# Patient Record
Sex: Male | Born: 1935 | Race: Black or African American | Hispanic: No | Marital: Married
Health system: Southern US, Community
[De-identification: ages and names within clinical notes are randomized; demographics above are authoritative.]

## PROBLEM LIST (undated history)

## (undated) ENCOUNTER — Emergency Department (HOSPITAL_COMMUNITY): Discharge status: Against Medical Advice | Payer: Self-pay

---

## 2004-12-26 ENCOUNTER — Emergency Department: Payer: Self-pay | Admitting: Emergency Medicine

## 2005-01-26 ENCOUNTER — Ambulatory Visit: Payer: Self-pay | Admitting: Internal Medicine

## 2005-12-26 ENCOUNTER — Inpatient Hospital Stay: Payer: Self-pay | Admitting: Surgery

## 2005-12-26 ENCOUNTER — Other Ambulatory Visit: Payer: Self-pay

## 2006-01-10 ENCOUNTER — Emergency Department: Payer: Self-pay | Admitting: Emergency Medicine

## 2007-09-06 ENCOUNTER — Emergency Department: Payer: Self-pay | Admitting: Emergency Medicine

## 2008-09-02 ENCOUNTER — Emergency Department: Payer: Self-pay | Admitting: Emergency Medicine

## 2008-11-30 ENCOUNTER — Ambulatory Visit: Payer: Self-pay | Admitting: Family Medicine

## 2009-02-23 ENCOUNTER — Ambulatory Visit: Payer: Self-pay | Admitting: Internal Medicine

## 2009-05-09 ENCOUNTER — Emergency Department: Payer: Self-pay | Admitting: Emergency Medicine

## 2010-08-18 ENCOUNTER — Emergency Department: Payer: Self-pay | Admitting: Emergency Medicine

## 2012-08-06 ENCOUNTER — Ambulatory Visit: Payer: Self-pay | Admitting: Internal Medicine

## 2012-09-28 ENCOUNTER — Emergency Department: Payer: Self-pay | Admitting: Emergency Medicine

## 2012-09-28 LAB — COMPREHENSIVE METABOLIC PANEL
Albumin: 2.7 g/dL — ABNORMAL LOW (ref 3.4–5.0)
Alkaline Phosphatase: 186 U/L — ABNORMAL HIGH (ref 50–136)
Anion Gap: 5 — ABNORMAL LOW (ref 7–16)
BUN: 15 mg/dL (ref 7–18)
Bilirubin,Total: 0.7 mg/dL (ref 0.2–1.0)
Chloride: 104 mmol/L (ref 98–107)
Creatinine: 1.04 mg/dL (ref 0.60–1.30)
EGFR (African American): >60
Osmolality: 272 (ref 275–301)
Potassium: 4.4 mmol/L (ref 3.5–5.1)
SGOT(AST): 49 U/L — ABNORMAL HIGH (ref 15–37)
SGPT (ALT): 12 U/L (ref 12–78)
Sodium: 135 mmol/L — ABNORMAL LOW (ref 136–145)
Total Protein: 8.2 g/dL (ref 6.4–8.2)

## 2012-09-28 LAB — CBC
HCT: 41 % (ref 40.0–52.0)
HGB: 13.3 g/dL (ref 13.0–18.0)
MCH: 25.4 pg — ABNORMAL LOW (ref 26.0–34.0)
MCHC: 32.5 g/dL (ref 32.0–36.0)
Platelet: 302 10*3/uL (ref 150–440)
RBC: 5.24 10*6/uL (ref 4.40–5.90)
WBC: 9.6 10*3/uL (ref 3.8–10.6)

## 2012-09-28 LAB — TROPONIN I: Troponin-I: <0.02 ng/mL

## 2012-10-03 LAB — CULTURE, BLOOD (SINGLE)

## 2012-10-09 ENCOUNTER — Ambulatory Visit: Payer: Self-pay | Admitting: Physician Assistant

## 2012-10-15 ENCOUNTER — Emergency Department: Payer: Self-pay | Admitting: Emergency Medicine

## 2012-10-15 LAB — CBC
HGB: 14.5 g/dL (ref 13.0–18.0)
MCH: 25.5 pg — ABNORMAL LOW (ref 26.0–34.0)
MCHC: 32.9 g/dL (ref 32.0–36.0)
Platelet: 391 10*3/uL (ref 150–440)
RDW: 16.4 % — ABNORMAL HIGH (ref 11.5–14.5)
WBC: 11.4 10*3/uL — ABNORMAL HIGH (ref 3.8–10.6)

## 2012-10-15 LAB — COMPREHENSIVE METABOLIC PANEL
Alkaline Phosphatase: 338 U/L — ABNORMAL HIGH (ref 50–136)
BUN: 18 mg/dL (ref 7–18)
Chloride: 104 mmol/L (ref 98–107)
Co2: 25 mmol/L (ref 21–32)
Creatinine: 1.21 mg/dL (ref 0.60–1.30)
EGFR (African American): >60
EGFR (Non-African Amer.): 56 — ABNORMAL LOW
Glucose: 113 mg/dL — ABNORMAL HIGH (ref 65–99)
Potassium: 5.1 mmol/L (ref 3.5–5.1)
Sodium: 135 mmol/L — ABNORMAL LOW (ref 136–145)

## 2012-10-15 LAB — LIPASE, BLOOD: Lipase: 53 U/L — ABNORMAL LOW (ref 73–393)

## 2012-10-15 LAB — CK TOTAL AND CKMB (NOT AT ARMC): CK, Total: 103 U/L (ref 35–232)

## 2012-10-31 ENCOUNTER — Emergency Department: Payer: Self-pay | Admitting: Emergency Medicine

## 2012-10-31 LAB — URINALYSIS, COMPLETE
Blood: NEGATIVE
Glucose,UR: NEGATIVE mg/dL (ref 0–75)
Ketone: NEGATIVE
Leukocyte Esterase: NEGATIVE
Nitrite: NEGATIVE
Ph: 5 (ref 4.5–8.0)
Protein: 30
Squamous Epithelial: <1
WBC UR: 3 /HPF (ref 0–5)

## 2012-10-31 LAB — CK TOTAL AND CKMB (NOT AT ARMC)
CK, Total: 90 U/L (ref 35–232)
CK-MB: <0.5 ng/mL — ABNORMAL LOW (ref 0.5–3.6)

## 2012-10-31 LAB — TROPONIN I: Troponin-I: <0.02 ng/mL

## 2012-10-31 LAB — COMPREHENSIVE METABOLIC PANEL
Albumin: 2.3 g/dL — ABNORMAL LOW (ref 3.4–5.0)
BUN: 24 mg/dL — ABNORMAL HIGH (ref 7–18)
Chloride: 103 mmol/L (ref 98–107)
Co2: 25 mmol/L (ref 21–32)
Creatinine: 1.25 mg/dL (ref 0.60–1.30)
EGFR (African American): >60
EGFR (Non-African Amer.): 54 — ABNORMAL LOW
Potassium: 5.1 mmol/L (ref 3.5–5.1)
Sodium: 134 mmol/L — ABNORMAL LOW (ref 136–145)

## 2012-10-31 LAB — MAGNESIUM: Magnesium: 2.1 mg/dL

## 2012-10-31 LAB — CBC
HGB: 12.8 g/dL — ABNORMAL LOW (ref 13.0–18.0)
MCH: 24.9 pg — ABNORMAL LOW (ref 26.0–34.0)
MCHC: 32.4 g/dL (ref 32.0–36.0)
MCV: 77 fL — ABNORMAL LOW (ref 80–100)
Platelet: 341 10*3/uL (ref 150–440)
RBC: 5.15 10*6/uL (ref 4.40–5.90)
WBC: 11 10*3/uL — ABNORMAL HIGH (ref 3.8–10.6)

## 2012-10-31 LAB — LIPASE, BLOOD: Lipase: 72 U/L — ABNORMAL LOW (ref 73–393)

## 2012-11-09 ENCOUNTER — Emergency Department: Payer: Self-pay | Admitting: Emergency Medicine

## 2012-11-09 LAB — COMPREHENSIVE METABOLIC PANEL
Albumin: 2.3 g/dL — ABNORMAL LOW (ref 3.4–5.0)
Alkaline Phosphatase: 414 U/L — ABNORMAL HIGH (ref 50–136)
Anion Gap: 6 — ABNORMAL LOW (ref 7–16)
BUN: 19 mg/dL — ABNORMAL HIGH (ref 7–18)
Chloride: 104 mmol/L (ref 98–107)
Co2: 25 mmol/L (ref 21–32)
Creatinine: 1.32 mg/dL — ABNORMAL HIGH (ref 0.60–1.30)
EGFR (African American): 59 — ABNORMAL LOW
Glucose: 118 mg/dL — ABNORMAL HIGH (ref 65–99)
Osmolality: 273 (ref 275–301)
Potassium: 4.5 mmol/L (ref 3.5–5.1)
SGOT(AST): 119 U/L — ABNORMAL HIGH (ref 15–37)
Sodium: 135 mmol/L — ABNORMAL LOW (ref 136–145)
Total Protein: 8.6 g/dL — ABNORMAL HIGH (ref 6.4–8.2)

## 2012-11-09 LAB — CBC WITH DIFFERENTIAL/PLATELET
Eosinophil #: 0 10*3/uL (ref 0.0–0.7)
HCT: 40.4 % (ref 40.0–52.0)
HGB: 12.8 g/dL — ABNORMAL LOW (ref 13.0–18.0)
Lymphocyte #: 1.2 10*3/uL (ref 1.0–3.6)
Lymphocyte %: 11.7 %
MCH: 24 pg — ABNORMAL LOW (ref 26.0–34.0)
MCV: 76 fL — ABNORMAL LOW (ref 80–100)
Monocyte #: 0.8 x10 3/mm (ref 0.2–1.0)
Neutrophil #: 8.5 10*3/uL — ABNORMAL HIGH (ref 1.4–6.5)
Platelet: 377 10*3/uL (ref 150–440)
RDW: 17 % — ABNORMAL HIGH (ref 11.5–14.5)
WBC: 10.7 10*3/uL — ABNORMAL HIGH (ref 3.8–10.6)

## 2012-11-10 LAB — URINALYSIS, COMPLETE
Bacteria: NONE SEEN
Bilirubin,UR: NEGATIVE
Blood: NEGATIVE
Glucose,UR: NEGATIVE mg/dL (ref 0–75)
Leukocyte Esterase: NEGATIVE
Ph: 5 (ref 4.5–8.0)
Protein: 100
Squamous Epithelial: NONE SEEN
WBC UR: 2 /HPF (ref 0–5)

## 2012-11-11 ENCOUNTER — Ambulatory Visit: Payer: Self-pay | Admitting: Physician Assistant

## 2012-11-11 LAB — COMPREHENSIVE METABOLIC PANEL
Alkaline Phosphatase: 394 U/L — ABNORMAL HIGH (ref 50–136)
Anion Gap: 8 (ref 7–16)
Bilirubin,Total: 1.6 mg/dL — ABNORMAL HIGH (ref 0.2–1.0)
Calcium, Total: 8.7 mg/dL (ref 8.5–10.1)
Chloride: 102 mmol/L (ref 98–107)
Creatinine: 1.26 mg/dL (ref 0.60–1.30)
EGFR (Non-African Amer.): 54 — ABNORMAL LOW
Glucose: 112 mg/dL — ABNORMAL HIGH (ref 65–99)
Osmolality: 272 (ref 275–301)
Potassium: 4.7 mmol/L (ref 3.5–5.1)
SGOT(AST): 147 U/L — ABNORMAL HIGH (ref 15–37)
SGPT (ALT): 26 U/L (ref 12–78)

## 2012-11-11 LAB — CBC WITH DIFFERENTIAL/PLATELET
Basophil #: 0.2 10*3/uL — ABNORMAL HIGH (ref 0.0–0.1)
Eosinophil #: 0 10*3/uL (ref 0.0–0.7)
Eosinophil %: 0 %
HCT: 38.1 % — ABNORMAL LOW (ref 40.0–52.0)
HGB: 12.3 g/dL — ABNORMAL LOW (ref 13.0–18.0)
Lymphocyte %: 10.3 %
MCH: 24.4 pg — ABNORMAL LOW (ref 26.0–34.0)
Monocyte #: 0.8 x10 3/mm (ref 0.2–1.0)
Neutrophil #: 8.8 10*3/uL — ABNORMAL HIGH (ref 1.4–6.5)
Neutrophil %: 80.9 %
WBC: 10.9 10*3/uL — ABNORMAL HIGH (ref 3.8–10.6)

## 2012-11-11 LAB — TSH: Thyroid Stimulating Horm: 1.1 u[IU]/mL

## 2012-11-13 ENCOUNTER — Ambulatory Visit: Payer: Self-pay | Admitting: Physician Assistant

## 2012-12-12 ENCOUNTER — Inpatient Hospital Stay: Payer: Self-pay | Admitting: Student

## 2012-12-12 LAB — COMPREHENSIVE METABOLIC PANEL
Albumin: 2 g/dL — ABNORMAL LOW (ref 3.4–5.0)
Alkaline Phosphatase: 444 U/L — ABNORMAL HIGH (ref 50–136)
Anion Gap: 10 (ref 7–16)
BUN: 21 mg/dL — ABNORMAL HIGH (ref 7–18)
Bilirubin,Total: 2.1 mg/dL — ABNORMAL HIGH (ref 0.2–1.0)
Chloride: 102 mmol/L (ref 98–107)
Co2: 25 mmol/L (ref 21–32)
EGFR (African American): >60
EGFR (Non-African Amer.): >60
Glucose: 111 mg/dL — ABNORMAL HIGH (ref 65–99)
Osmolality: 277 (ref 275–301)
Potassium: 4 mmol/L (ref 3.5–5.1)
SGOT(AST): 78 U/L — ABNORMAL HIGH (ref 15–37)

## 2012-12-12 LAB — URINALYSIS, COMPLETE
Blood: NEGATIVE
Glucose,UR: NEGATIVE mg/dL (ref 0–75)
Nitrite: NEGATIVE
Ph: 5 (ref 4.5–8.0)
Protein: 30
RBC,UR: 1 /HPF (ref 0–5)
Specific Gravity: 1.025 (ref 1.003–1.030)
WBC UR: 5 /HPF (ref 0–5)

## 2012-12-12 LAB — PRO B NATRIURETIC PEPTIDE: B-Type Natriuretic Peptide: 336 pg/mL (ref 0–450)

## 2012-12-12 LAB — CBC
HCT: 33.7 % — ABNORMAL LOW (ref 40.0–52.0)
HGB: 10.8 g/dL — ABNORMAL LOW (ref 13.0–18.0)
MCH: 24 pg — ABNORMAL LOW (ref 26.0–34.0)
MCHC: 32.1 g/dL (ref 32.0–36.0)
MCV: 75 fL — ABNORMAL LOW (ref 80–100)
RDW: 18.9 % — ABNORMAL HIGH (ref 11.5–14.5)
WBC: 8.9 10*3/uL (ref 3.8–10.6)

## 2012-12-12 LAB — TSH: Thyroid Stimulating Horm: 0.617 u[IU]/mL

## 2012-12-12 LAB — TROPONIN I: Troponin-I: <0.02 ng/mL

## 2012-12-12 LAB — CK TOTAL AND CKMB (NOT AT ARMC): CK-MB: <0.5 ng/mL — ABNORMAL LOW (ref 0.5–3.6)

## 2012-12-13 LAB — CBC WITH DIFFERENTIAL/PLATELET
Basophil %: 0.2 %
Eosinophil #: 0 10*3/uL (ref 0.0–0.7)
Eosinophil %: 0.3 %
Lymphocyte #: 1 10*3/uL (ref 1.0–3.6)
MCHC: 32.1 g/dL (ref 32.0–36.0)
MCV: 75 fL — ABNORMAL LOW (ref 80–100)
Monocyte #: 0.5 x10 3/mm (ref 0.2–1.0)
Monocyte %: 7.4 %
Neutrophil #: 5.6 10*3/uL (ref 1.4–6.5)
Neutrophil %: 77.7 %
RBC: 3.8 10*6/uL — ABNORMAL LOW (ref 4.40–5.90)
RDW: 18.9 % — ABNORMAL HIGH (ref 11.5–14.5)
WBC: 7.2 10*3/uL (ref 3.8–10.6)

## 2012-12-13 LAB — BASIC METABOLIC PANEL
BUN: 15 mg/dL (ref 7–18)
Calcium, Total: 8.7 mg/dL (ref 8.5–10.1)
Chloride: 105 mmol/L (ref 98–107)
Co2: 26 mmol/L (ref 21–32)
EGFR (African American): >60
EGFR (Non-African Amer.): >60
Glucose: 115 mg/dL — ABNORMAL HIGH (ref 65–99)
Potassium: 4.1 mmol/L (ref 3.5–5.1)
Sodium: 138 mmol/L (ref 136–145)

## 2012-12-13 LAB — MAGNESIUM: Magnesium: 1.7 mg/dL — ABNORMAL LOW

## 2012-12-14 LAB — HEPATIC FUNCTION PANEL A (ARMC)
Albumin: 1.5 g/dL — ABNORMAL LOW (ref 3.4–5.0)
Alkaline Phosphatase: 426 U/L — ABNORMAL HIGH (ref 50–136)
Bilirubin,Total: 1.7 mg/dL — ABNORMAL HIGH (ref 0.2–1.0)
SGOT(AST): 90 U/L — ABNORMAL HIGH (ref 15–37)
SGPT (ALT): <6 U/L — ABNORMAL LOW (ref 12–78)
Total Protein: 6.1 g/dL — ABNORMAL LOW (ref 6.4–8.2)

## 2012-12-15 LAB — PHOSPHORUS: Phosphorus: 2.5 mg/dL (ref 2.5–4.9)

## 2012-12-15 LAB — PSA: PSA: 427 ng/mL — ABNORMAL HIGH (ref 0.0–4.0)

## 2012-12-16 LAB — PATHOLOGY REPORT

## 2012-12-17 LAB — COMPREHENSIVE METABOLIC PANEL
Albumin: 1.3 g/dL — ABNORMAL LOW (ref 3.4–5.0)
Alkaline Phosphatase: 459 U/L — ABNORMAL HIGH (ref 50–136)
Anion Gap: 9 (ref 7–16)
Bilirubin,Total: 1.1 mg/dL — ABNORMAL HIGH (ref 0.2–1.0)
Chloride: 103 mmol/L (ref 98–107)
Co2: 23 mmol/L (ref 21–32)
Creatinine: 0.75 mg/dL (ref 0.60–1.30)
EGFR (African American): >60
EGFR (Non-African Amer.): >60
Osmolality: 271 (ref 275–301)
Potassium: 4.4 mmol/L (ref 3.5–5.1)
Sodium: 135 mmol/L — ABNORMAL LOW (ref 136–145)

## 2012-12-17 LAB — CALCIUM: Calcium, Total: 8.3 mg/dL — ABNORMAL LOW (ref 8.5–10.1)

## 2012-12-28 ENCOUNTER — Ambulatory Visit: Payer: Self-pay | Admitting: Internal Medicine

## 2012-12-28 LAB — FERRITIN: Ferritin (ARMC): 2356 ng/mL — ABNORMAL HIGH (ref 8–388)

## 2012-12-28 LAB — CBC CANCER CENTER
Basophil #: 0.1 x10 3/mm (ref 0.0–0.1)
Basophil %: 1.3 %
Eosinophil #: 0 x10 3/mm (ref 0.0–0.7)
Eosinophil %: 0.5 %
Lymphocyte #: 1.1 x10 3/mm (ref 1.0–3.6)
Lymphocyte %: 15.7 %
MCH: 23.3 pg — ABNORMAL LOW (ref 26.0–34.0)
MCHC: 31.1 g/dL — ABNORMAL LOW (ref 32.0–36.0)
Monocyte #: 0.4 x10 3/mm (ref 0.2–1.0)
Neutrophil #: 5.5 x10 3/mm (ref 1.4–6.5)
Neutrophil %: 77.2 %
Platelet: 354 x10 3/mm (ref 150–440)
RBC: 3.86 10*6/uL — ABNORMAL LOW (ref 4.40–5.90)
RDW: 21.6 % — ABNORMAL HIGH (ref 11.5–14.5)

## 2012-12-28 LAB — CREATININE, SERUM
Creatinine: 1.08 mg/dL (ref 0.60–1.30)
EGFR (African American): >60
EGFR (Non-African Amer.): >60

## 2012-12-28 LAB — IRON AND TIBC: Iron Bind.Cap.(Total): 141 ug/dL — ABNORMAL LOW (ref 250–450)

## 2012-12-30 LAB — CEA: CEA: 2 ng/mL (ref 0.0–4.7)

## 2013-01-15 LAB — COMPREHENSIVE METABOLIC PANEL
Albumin: 2 g/dL — ABNORMAL LOW (ref 3.4–5.0)
Alkaline Phosphatase: 466 U/L — ABNORMAL HIGH (ref 50–136)
Anion Gap: 9 (ref 7–16)
Chloride: 100 mmol/L (ref 98–107)
Co2: 25 mmol/L (ref 21–32)
Creatinine: 1.09 mg/dL (ref 0.60–1.30)
EGFR (African American): >60
EGFR (Non-African Amer.): >60
SGOT(AST): 24 U/L (ref 15–37)
Sodium: 134 mmol/L — ABNORMAL LOW (ref 136–145)
Total Protein: 6.9 g/dL (ref 6.4–8.2)

## 2013-01-15 LAB — CBC CANCER CENTER
Basophil #: 0.1 "x10 3/mm "
Basophil %: 1.2 %
Eosinophil #: 0 "x10 3/mm "
Eosinophil %: 0.5 %
HCT: 28.8 % — ABNORMAL LOW
HGB: 9.2 g/dL — ABNORMAL LOW
Lymphocyte %: 25.2 %
Lymphs Abs: 1.6 "x10 3/mm "
MCH: 24.8 pg — ABNORMAL LOW
MCHC: 32 g/dL
MCV: 78 fL — ABNORMAL LOW
Monocyte #: 0.5 "x10 3/mm "
Monocyte %: 8.2 %
Neutrophil #: 4.1 "x10 3/mm "
Neutrophil %: 64.9 %
Platelet: 284 "x10 3/mm "
RBC: 3.72 "x10 6/mm " — ABNORMAL LOW
RDW: 25 % — ABNORMAL HIGH
WBC: 6.3 "x10 3/mm "

## 2013-01-17 ENCOUNTER — Ambulatory Visit: Payer: Self-pay | Admitting: Internal Medicine

## 2013-01-27 LAB — COMPREHENSIVE METABOLIC PANEL
Albumin: 2.1 g/dL — ABNORMAL LOW (ref 3.4–5.0)
Alkaline Phosphatase: 339 U/L — ABNORMAL HIGH (ref 50–136)
Anion Gap: 11 (ref 7–16)
Bilirubin,Total: 1.3 mg/dL — ABNORMAL HIGH (ref 0.2–1.0)
Co2: 23 mmol/L (ref 21–32)
Creatinine: 1.08 mg/dL (ref 0.60–1.30)
EGFR (Non-African Amer.): >60
Osmolality: 272 (ref 275–301)
Potassium: 3.8 mmol/L (ref 3.5–5.1)
SGOT(AST): 51 U/L — ABNORMAL HIGH (ref 15–37)
SGPT (ALT): <6 U/L — ABNORMAL LOW (ref 12–78)
Sodium: 135 mmol/L — ABNORMAL LOW (ref 136–145)
Total Protein: 6.8 g/dL (ref 6.4–8.2)

## 2013-01-27 LAB — CBC
HGB: 9.1 g/dL — ABNORMAL LOW (ref 13.0–18.0)
MCH: 24.7 pg — ABNORMAL LOW (ref 26.0–34.0)
MCHC: 32.1 g/dL (ref 32.0–36.0)
MCV: 77 fL — ABNORMAL LOW (ref 80–100)
Platelet: 330 10*3/uL (ref 150–440)
RBC: 3.68 10*6/uL — ABNORMAL LOW (ref 4.40–5.90)
RDW: 23.3 % — ABNORMAL HIGH (ref 11.5–14.5)
WBC: 8 10*3/uL (ref 3.8–10.6)

## 2013-01-27 LAB — URINALYSIS, COMPLETE
Nitrite: NEGATIVE
RBC,UR: 2 /HPF (ref 0–5)
Specific Gravity: 1.03 (ref 1.003–1.030)
Squamous Epithelial: 6
WBC UR: 6 /HPF (ref 0–5)

## 2013-01-27 LAB — CK TOTAL AND CKMB (NOT AT ARMC): CK-MB: 0.5 ng/mL (ref 0.5–3.6)

## 2013-01-27 LAB — LIPASE, BLOOD: Lipase: 65 U/L — ABNORMAL LOW (ref 73–393)

## 2013-01-28 LAB — COMPREHENSIVE METABOLIC PANEL
Albumin: 1.8 g/dL — ABNORMAL LOW (ref 3.4–5.0)
Anion Gap: 9 (ref 7–16)
BUN: 15 mg/dL (ref 7–18)
Bilirubin,Total: 1.1 mg/dL — ABNORMAL HIGH (ref 0.2–1.0)
Co2: 23 mmol/L (ref 21–32)
Osmolality: 274 (ref 275–301)
Potassium: 3.7 mmol/L (ref 3.5–5.1)
SGOT(AST): 41 U/L — ABNORMAL HIGH (ref 15–37)

## 2013-01-28 LAB — CBC WITH DIFFERENTIAL/PLATELET
Basophil %: 1 %
Eosinophil #: 0 10*3/uL (ref 0.0–0.7)
Eosinophil %: 0.2 %
HGB: 7.6 g/dL — ABNORMAL LOW (ref 13.0–18.0)
Lymphocyte #: 1.2 10*3/uL (ref 1.0–3.6)
Lymphocyte %: 17.8 %
MCH: 24.8 pg — ABNORMAL LOW (ref 26.0–34.0)
MCHC: 31.5 g/dL — ABNORMAL LOW (ref 32.0–36.0)
MCV: 79 fL — ABNORMAL LOW (ref 80–100)
Monocyte #: 0.5 x10 3/mm (ref 0.2–1.0)
Monocyte %: 7 %
Neutrophil #: 5.1 10*3/uL (ref 1.4–6.5)
Platelet: 272 10*3/uL (ref 150–440)
RBC: 3.08 10*6/uL — ABNORMAL LOW (ref 4.40–5.90)
WBC: 6.9 10*3/uL (ref 3.8–10.6)

## 2013-01-29 ENCOUNTER — Inpatient Hospital Stay: Payer: Self-pay | Admitting: Internal Medicine

## 2013-01-29 LAB — CBC WITH DIFFERENTIAL/PLATELET
Basophil %: 0.3 %
Eosinophil #: 0 10*3/uL (ref 0.0–0.7)
Eosinophil %: 0.1 %
HCT: 24.5 % — ABNORMAL LOW (ref 40.0–52.0)
Lymphocyte #: 0.9 10*3/uL — ABNORMAL LOW (ref 1.0–3.6)
MCHC: 31.8 g/dL — ABNORMAL LOW (ref 32.0–36.0)
Monocyte #: 0.4 x10 3/mm (ref 0.2–1.0)
Neutrophil %: 75.7 %
Platelet: 236 10*3/uL (ref 150–440)
RBC: 3.11 10*6/uL — ABNORMAL LOW (ref 4.40–5.90)

## 2013-01-29 LAB — COMPREHENSIVE METABOLIC PANEL
Alkaline Phosphatase: 276 U/L — ABNORMAL HIGH (ref 50–136)
Calcium, Total: 8.4 mg/dL — ABNORMAL LOW (ref 8.5–10.1)
Co2: 24 mmol/L (ref 21–32)
Creatinine: 0.92 mg/dL (ref 0.60–1.30)
EGFR (African American): >60
EGFR (Non-African Amer.): >60
Glucose: 104 mg/dL — ABNORMAL HIGH (ref 65–99)
Osmolality: 277 (ref 275–301)
SGOT(AST): 40 U/L — ABNORMAL HIGH (ref 15–37)

## 2013-01-30 LAB — HEPATIC FUNCTION PANEL A (ARMC)
Albumin: 1.6 g/dL — ABNORMAL LOW (ref 3.4–5.0)
Alkaline Phosphatase: 269 U/L — ABNORMAL HIGH (ref 50–136)
Bilirubin,Total: 0.9 mg/dL (ref 0.2–1.0)
SGOT(AST): 28 U/L (ref 15–37)
SGPT (ALT): <6 U/L — ABNORMAL LOW (ref 12–78)
Total Protein: 5.4 g/dL — ABNORMAL LOW (ref 6.4–8.2)

## 2013-01-30 LAB — POTASSIUM: Potassium: 3.5 mmol/L

## 2013-01-30 LAB — HEMOGLOBIN: HGB: 7.4 g/dL — ABNORMAL LOW

## 2013-01-31 LAB — MAGNESIUM: Magnesium: 1.6 mg/dL — ABNORMAL LOW

## 2013-02-01 LAB — HEMOGLOBIN: HGB: 7.5 g/dL — ABNORMAL LOW (ref 13.0–18.0)

## 2013-02-01 LAB — POTASSIUM: Potassium: 3.6 mmol/L (ref 3.5–5.1)

## 2013-02-01 LAB — MAGNESIUM: Magnesium: 1.7 mg/dL — ABNORMAL LOW

## 2013-02-08 ENCOUNTER — Ambulatory Visit: Payer: Self-pay | Admitting: Internal Medicine

## 2013-02-08 LAB — CBC CANCER CENTER
Eosinophil #: 0 x10 3/mm (ref 0.0–0.7)
HCT: 30.5 % — ABNORMAL LOW (ref 40.0–52.0)
HGB: 9.3 g/dL — ABNORMAL LOW (ref 13.0–18.0)
MCH: 24.1 pg — ABNORMAL LOW (ref 26.0–34.0)
Monocyte #: 0.6 x10 3/mm (ref 0.2–1.0)
Neutrophil %: 78.3 %
Platelet: 273 x10 3/mm (ref 150–440)
RBC: 3.87 10*6/uL — ABNORMAL LOW (ref 4.40–5.90)
WBC: 11.5 x10 3/mm — ABNORMAL HIGH (ref 3.8–10.6)

## 2013-02-08 LAB — HEPATIC FUNCTION PANEL A (ARMC)
Albumin: 1.8 g/dL — ABNORMAL LOW (ref 3.4–5.0)
Alkaline Phosphatase: 378 U/L — ABNORMAL HIGH (ref 50–136)
Bilirubin, Direct: 0.7 mg/dL — ABNORMAL HIGH (ref 0.00–0.20)
SGOT(AST): 49 U/L — ABNORMAL HIGH (ref 15–37)
SGPT (ALT): 7 U/L — ABNORMAL LOW (ref 12–78)
Total Protein: 6.9 g/dL (ref 6.4–8.2)

## 2013-02-08 LAB — CREATININE, SERUM
Creatinine: 1.26 mg/dL (ref 0.60–1.30)
EGFR (African American): >60
EGFR (Non-African Amer.): 54 — ABNORMAL LOW

## 2013-02-16 ENCOUNTER — Ambulatory Visit: Payer: Self-pay | Admitting: Internal Medicine

## 2013-02-16 ENCOUNTER — Inpatient Hospital Stay: Payer: Self-pay | Admitting: Internal Medicine

## 2013-02-16 LAB — CBC WITH DIFFERENTIAL/PLATELET
Bands: 3 %
HCT: 29.3 % — ABNORMAL LOW
HGB: 8.8 g/dL — ABNORMAL LOW
Lymphocytes: 23 %
MCH: 24 pg — ABNORMAL LOW
MCHC: 29.8 g/dL — ABNORMAL LOW
MCV: 80 fL
Myelocyte: 1 %
NRBC/100 WBC: 3 /
Platelet: 194 x10 3/mm 3
RBC: 3.65 x10 6/mm 3 — ABNORMAL LOW
RDW: 22.4 % — ABNORMAL HIGH
Segmented Neutrophils: 73 %
WBC: 16.9 x10 3/mm 3 — ABNORMAL HIGH

## 2013-02-16 LAB — COMPREHENSIVE METABOLIC PANEL
Alkaline Phosphatase: 437 U/L — ABNORMAL HIGH (ref 50–136)
Anion Gap: 21 — ABNORMAL HIGH (ref 7–16)
BUN: 33 mg/dL — ABNORMAL HIGH (ref 7–18)
Calcium, Total: 9 mg/dL (ref 8.5–10.1)
Co2: 14 mmol/L — ABNORMAL LOW (ref 21–32)
Creatinine: 1.67 mg/dL — ABNORMAL HIGH (ref 0.60–1.30)
EGFR (African American): 44 — ABNORMAL LOW
Osmolality: 289 (ref 275–301)
Sodium: 141 mmol/L (ref 136–145)
Total Protein: 6.1 g/dL — ABNORMAL LOW (ref 6.4–8.2)

## 2013-02-16 LAB — URINALYSIS, COMPLETE
Glucose,UR: 50 mg/dL (ref 0–75)
Hyaline Cast: 8
Leukocyte Esterase: NEGATIVE
Nitrite: NEGATIVE
Protein: 30
Squamous Epithelial: 1
WBC UR: 5 /HPF (ref 0–5)

## 2013-02-16 LAB — CK TOTAL AND CKMB (NOT AT ARMC): CK-MB: 0.6 ng/mL (ref 0.5–3.6)

## 2013-02-16 LAB — MAGNESIUM: Magnesium: 2.5 mg/dL — ABNORMAL HIGH

## 2013-02-16 LAB — TROPONIN I: Troponin-I: 0.03 ng/mL

## 2013-02-17 LAB — CBC WITH DIFFERENTIAL/PLATELET
Bands: 2 %
Lymphocytes: 10 %
MCH: 24.2 pg — ABNORMAL LOW (ref 26.0–34.0)
MCV: 79 fL — ABNORMAL LOW (ref 80–100)
Monocytes: 2 %
RDW: 22.2 % — ABNORMAL HIGH (ref 11.5–14.5)
Segmented Neutrophils: 85 %

## 2013-02-17 LAB — TROPONIN I: Troponin-I: 0.03 ng/mL

## 2013-02-17 LAB — BASIC METABOLIC PANEL
BUN: 34 mg/dL — ABNORMAL HIGH (ref 7–18)
Chloride: 111 mmol/L — ABNORMAL HIGH (ref 98–107)
EGFR (African American): >60
EGFR (Non-African Amer.): 53 — ABNORMAL LOW
Glucose: 105 mg/dL — ABNORMAL HIGH (ref 65–99)
Osmolality: 297 (ref 275–301)

## 2013-02-17 LAB — CK TOTAL AND CKMB (NOT AT ARMC)
CK, Total: 93 U/L (ref 35–232)
CK, Total: 215 U/L (ref 35–232)

## 2013-02-18 LAB — PSA: PSA: 664.1 ng/mL — ABNORMAL HIGH (ref 0.0–4.0)

## 2013-02-21 LAB — CULTURE, BLOOD (SINGLE)

## 2013-02-24 ENCOUNTER — Ambulatory Visit: Payer: Self-pay | Admitting: Internal Medicine

## 2013-03-19 ENCOUNTER — Ambulatory Visit: Payer: Self-pay | Admitting: Internal Medicine

## 2013-03-19 DEATH — deceased

## 2014-03-19 IMAGING — CR DG CHEST 1V PORT
1 series · 1 of 1 positions shown · non-contrast
Comparison: none

REASON FOR EXAM: altered mental status
COMMENTS:

[ap]
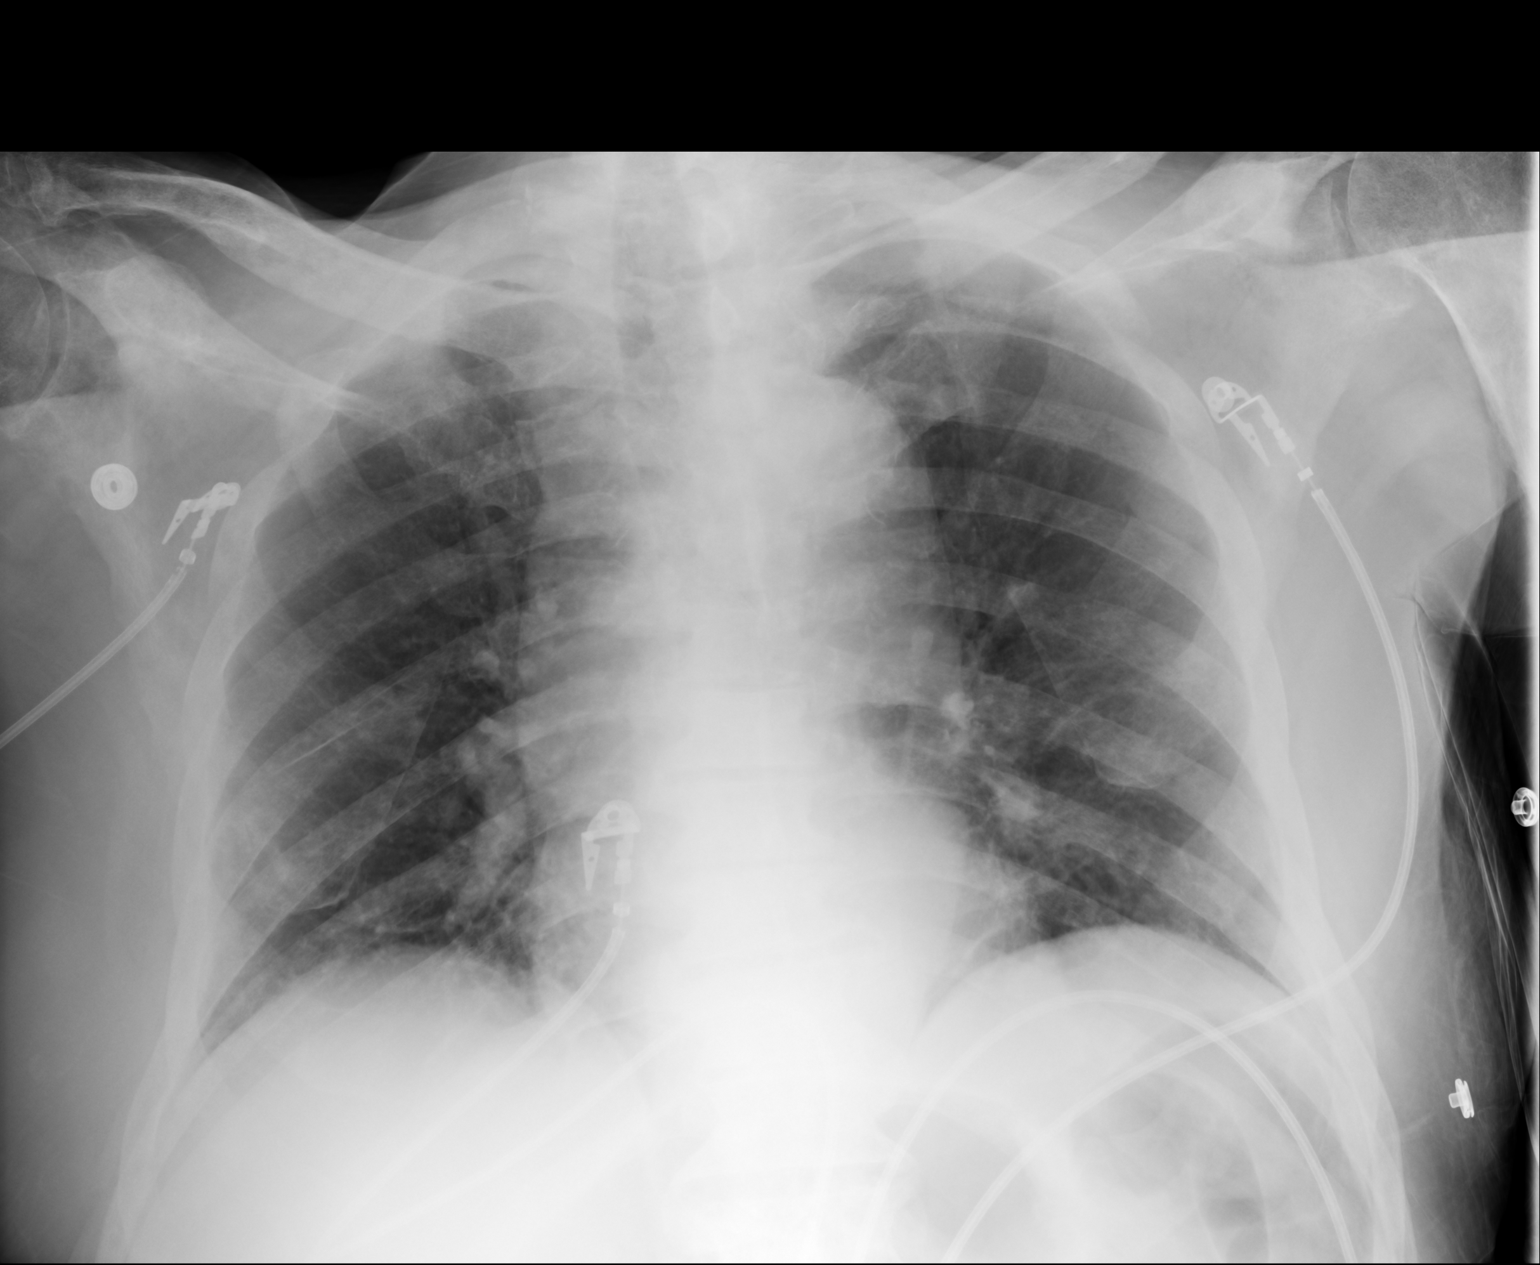

[1 of 1 positions shown; findings below may reference images not displayed]

PROCEDURE:     DXR - DXR PORTABLE CHEST SINGLE VIEW  - February 16, 2013  [DATE]

RESULT:     Comparison is made to the study January 29, 2013.

The lungs are reasonably well inflated. There are patchy increased
interstitial markings in the mid lungs which are more conspicuous than in
the past. There is no pleural effusion. There is tortuosity of the ascending
and descending thoracic aorta. The cardiac silhouette is not enlarged.
IMPRESSION: There are mildly increased interstitial markings. A portion
of this is related to the AP supine technique. I cannot exclude low-grade
interstitial edema however. A followup PA and lateral chest x-ray would be
of value.

[REDACTED]

## 2014-03-19 IMAGING — CT CT ABD-PELV W/O CM
1 of 2 series · 14 of 32 positions shown, 18 images · non-contrast
Comparison: none

REASON FOR EXAM: (1) abd pain; (2) abd pain
COMMENTS:

PROCEDURE:     CT  - CT ABDOMEN AND PELVIS W[DATE]  [DATE]
RESULT:      Comparison: CT of the chest, abdomen, and pelvis 10/15/2012
TECHNIQUE: Multiple axial images from the lung bases to the symphysis pubis
were obtained without oral and without intravenous contrast.

[Series 2: 3mm soft tissue · axial · 0.84mm/px · z∈[-879,-417]mm · 14 of 169 slices shown, 18 images]
[im 8/169  soft-tissue]
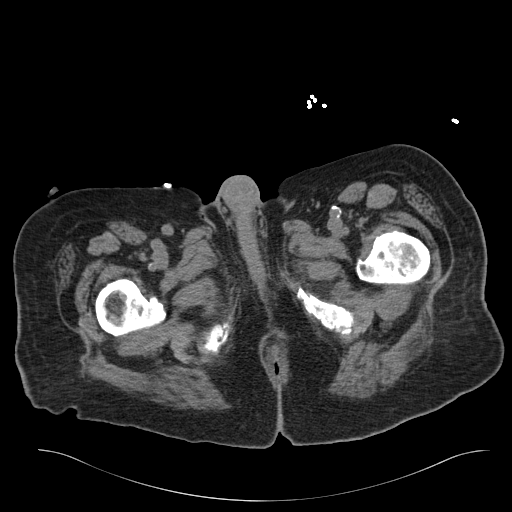
[im 8/169  bone]
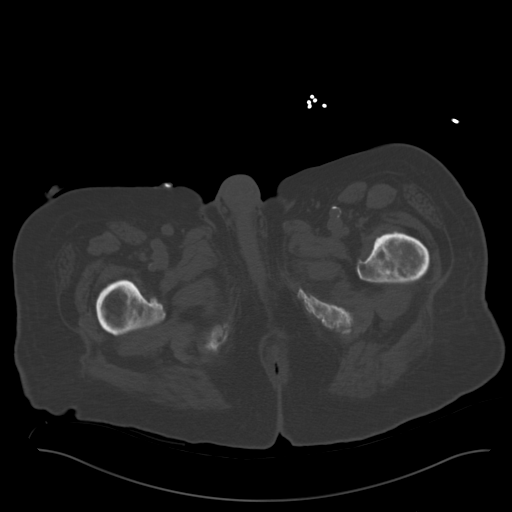
[im 22/169  soft-tissue]
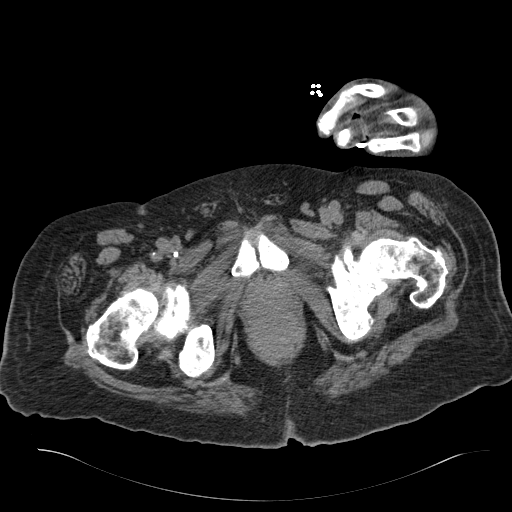
[im 36/169  soft-tissue]
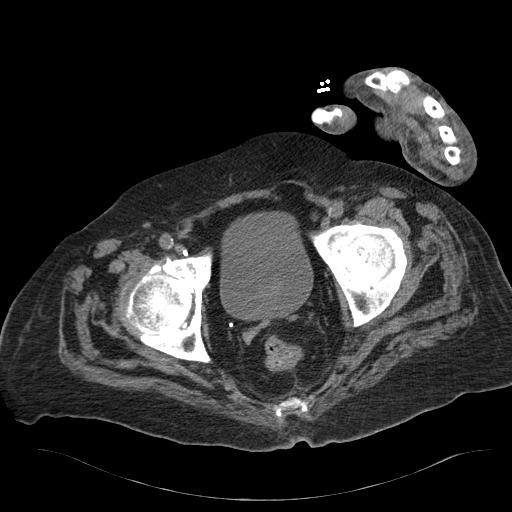
[im 50/169  soft-tissue]
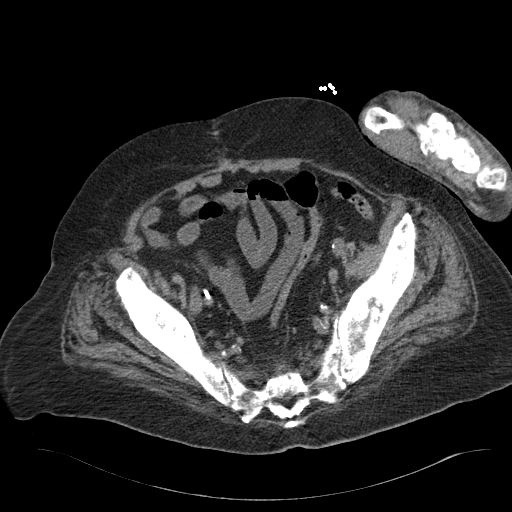
[im 64/169  soft-tissue]
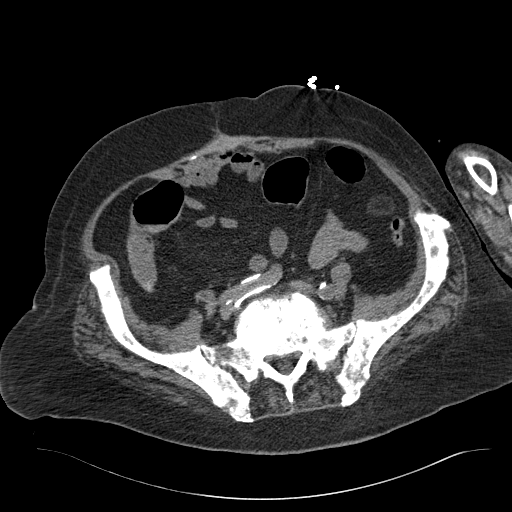
[im 78/169  soft-tissue]
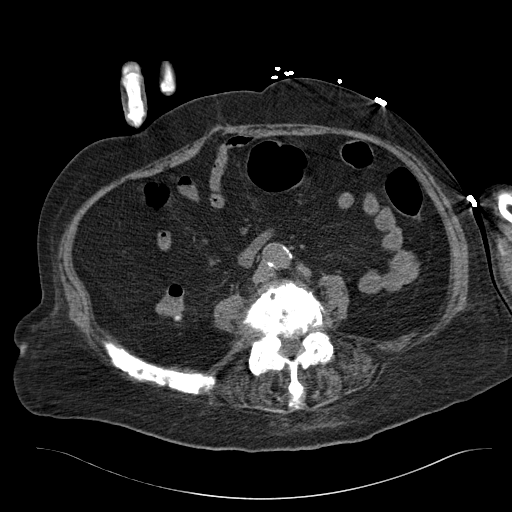
[im 92/169  soft-tissue]
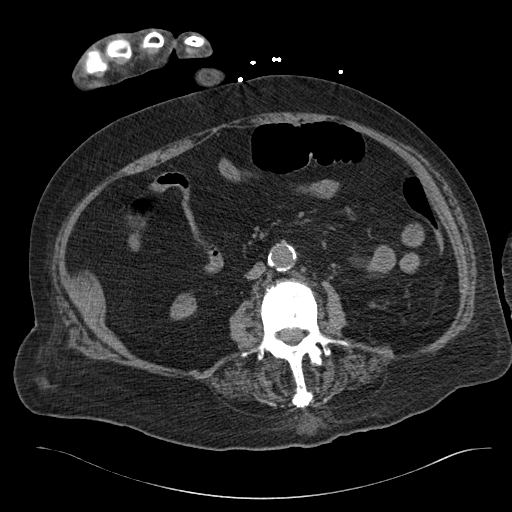
[im 106/169  soft-tissue]
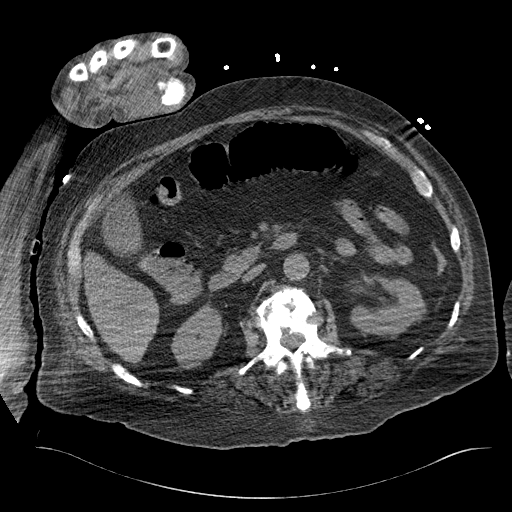
[im 120/169  soft-tissue]
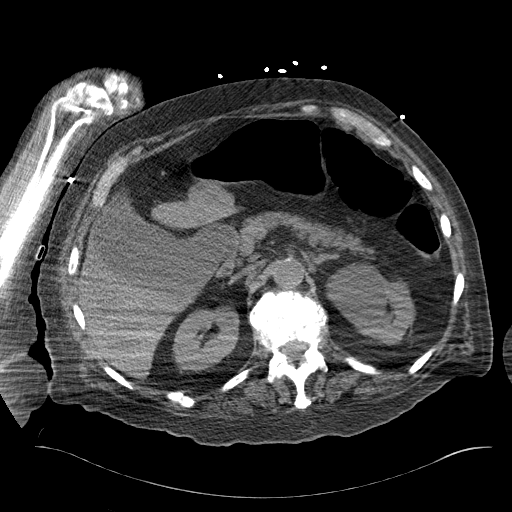
[im 120/169  bone]
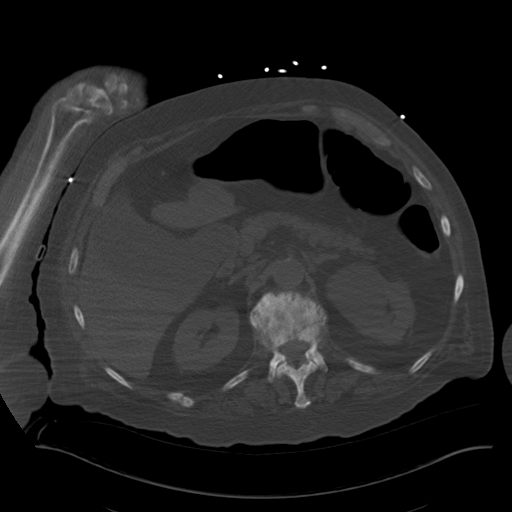
[im 134/169  soft-tissue]
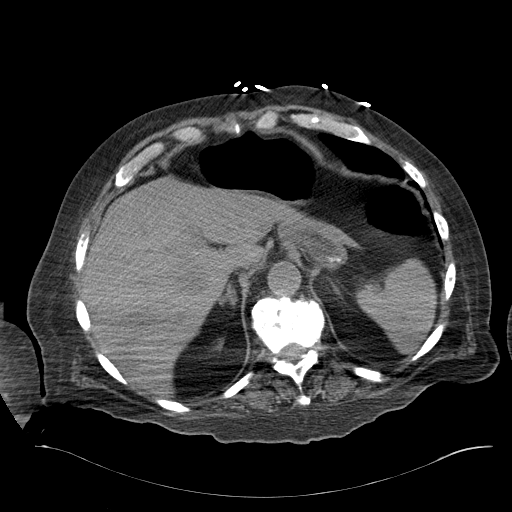
[im 141/169  lung]
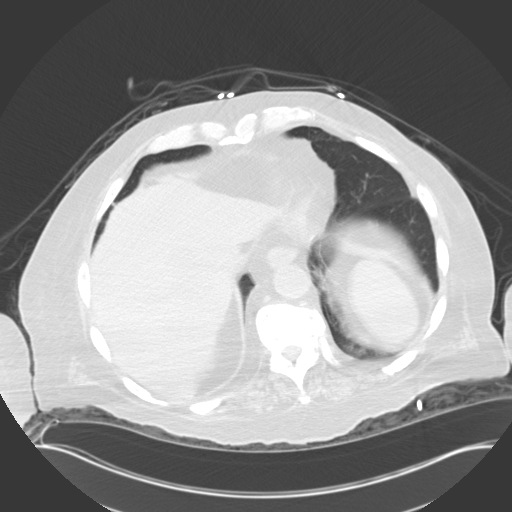
[im 148/169  soft-tissue]
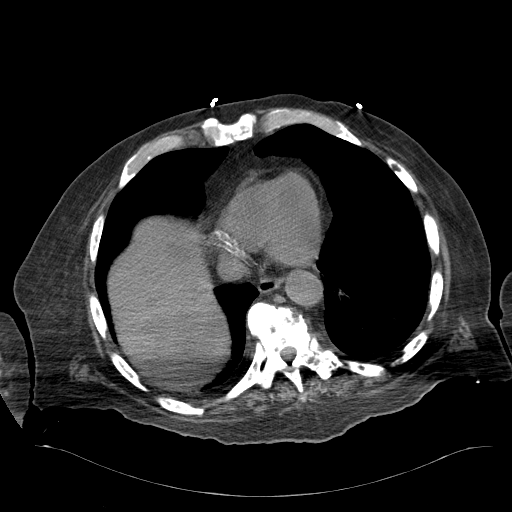
[im 148/169  lung]
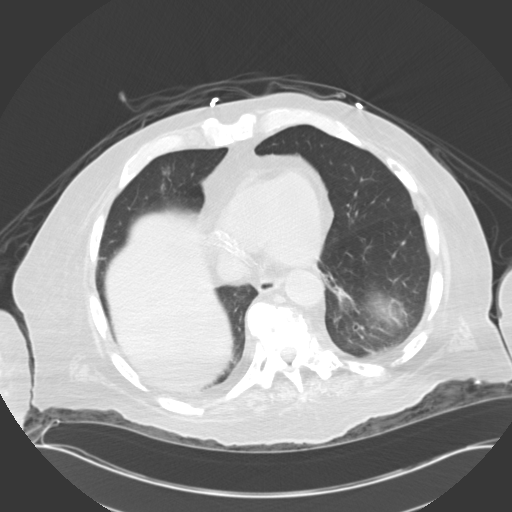
[im 155/169  lung]
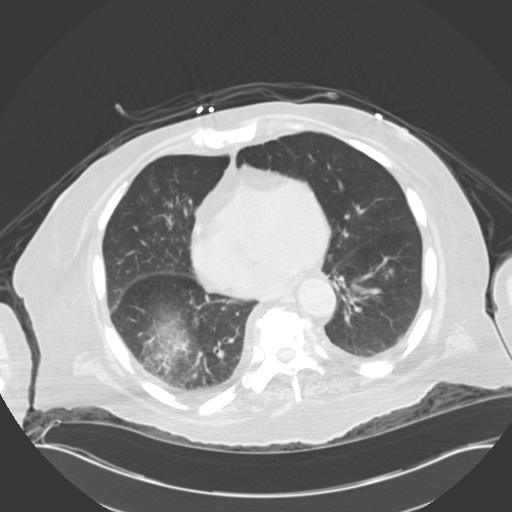
[im 162/169  soft-tissue]
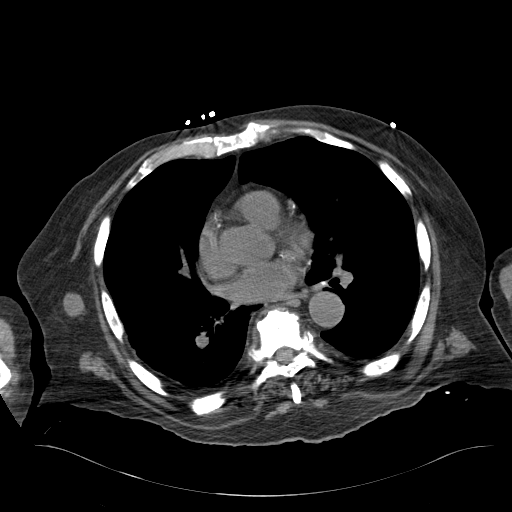
[im 162/169  lung]
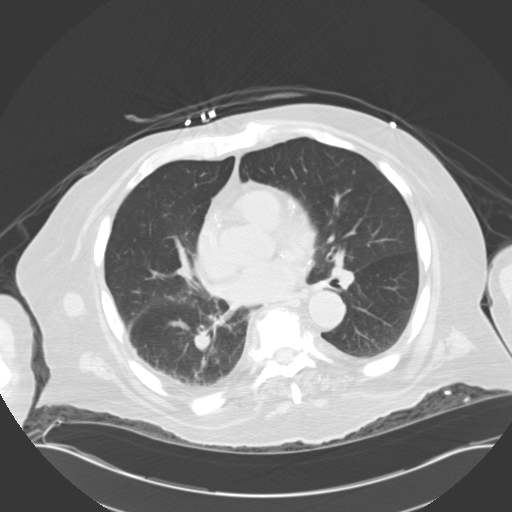

[14 of 32 positions shown; findings below may reference images not displayed]

FINDINGS: There are small centrilobular nodular densities in the right middle lobe and
right lower lobe. These are seen to a lesser extent in the left lower lobe.
Calcifications are seen in the coronary arteries. There is a a soft tissue
nodule in the subcutaneous fat along the right lateral chest wall. It
measures 1.9 cm in diameter.

Lack of intravenous contrast limits evaluation of the solid abdominal
organs.  There appear to be large areas of ill-defined hypoattenuation
throughout the liver are concerning for metastatic disease. The spleen,
adrenals, and pancreas are unremarkable. Low-attenuation lesion in left
kidney is indeterminate by density on today's study, but was previously
demonstrated to represent a cyst. No hydronephrosis. The gallbladder is
somewhat prominent in size. Gallstones are seen within the gallbladder.

There is prominent soft tissue thickening in the region of the rectum.
Malignancy cannot be excluded. The small and large bowel are normal in
caliber. There are H a few diverticula in the ascending colon.

There is mixed sclerosis and lucency throughout the visualized the proximal
femurs, pelvis, lumbar spine, and ribs. These findings are concerning for
osseous metastatic disease.
IMPRESSION: 1. Multiple ill-defined hypoattenuating lesions in the liver are
indeterminate. Differential would include metastatic disease. Further
evaluation could be provided with contrast enhanced CT.
2. There are findings concerning for widespread, extensive osseous
metastatic disease throughout the visualized skeleton.
3. There is a small soft tissue nodule in the subcutaneous fat along the
right lateral chest wall, which is indeterminate.
4. There is focal soft tissue thickening of the rectum. Malignancy cannot be
excluded. Direct visualization is suggested.
5. Cholelithiasis.
6. Tiny centrilobular nodules in the lung bases are nonspecific.
Differential would include aspiration and atypical infection.

[REDACTED]

## 2014-03-20 IMAGING — US US RENAL KIDNEY
1 series · 14 of 25 positions shown · non-contrast
Comparison: none

REASON FOR EXAM: ARF
COMMENTS:

[Series 1: us renal kidney · 0.35mm/px · 14 of 31 slices shown]
[im 1/31]
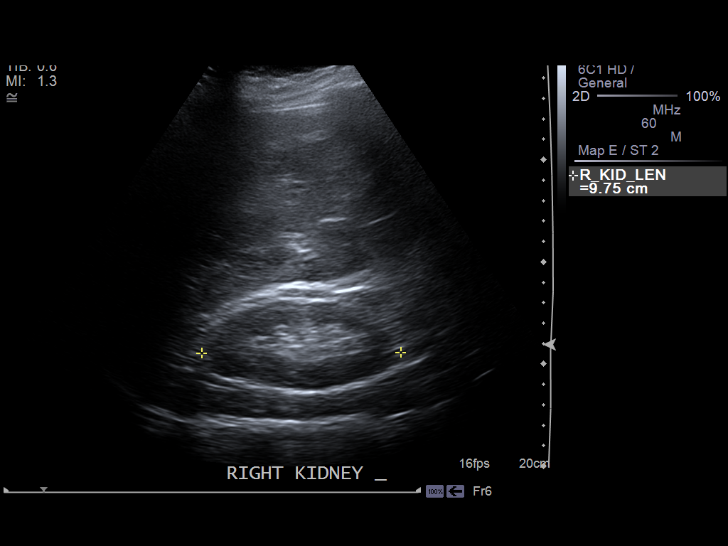
[im 3/31]
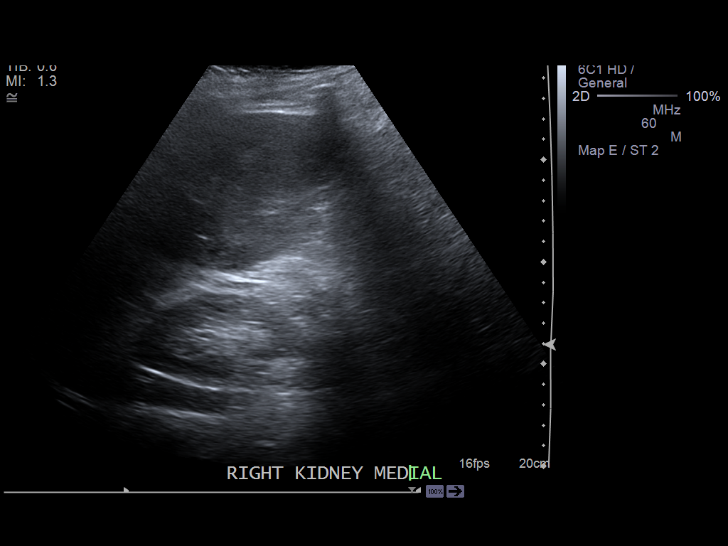
[im 6/31]
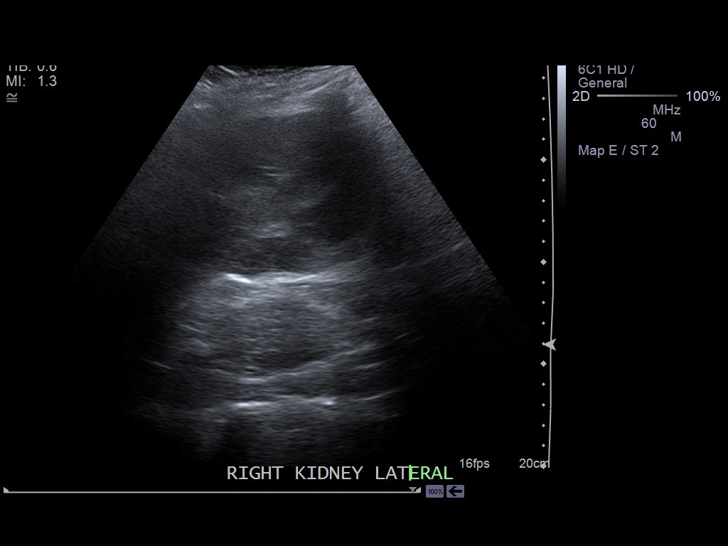
[im 8/31]
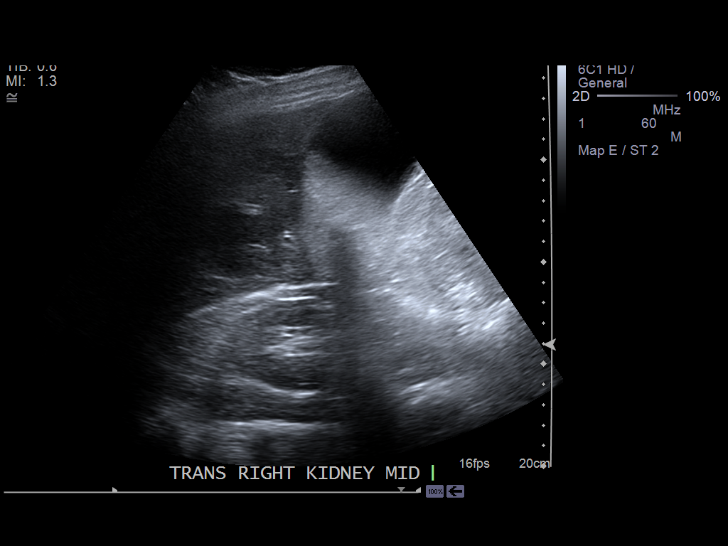
[im 11/31]
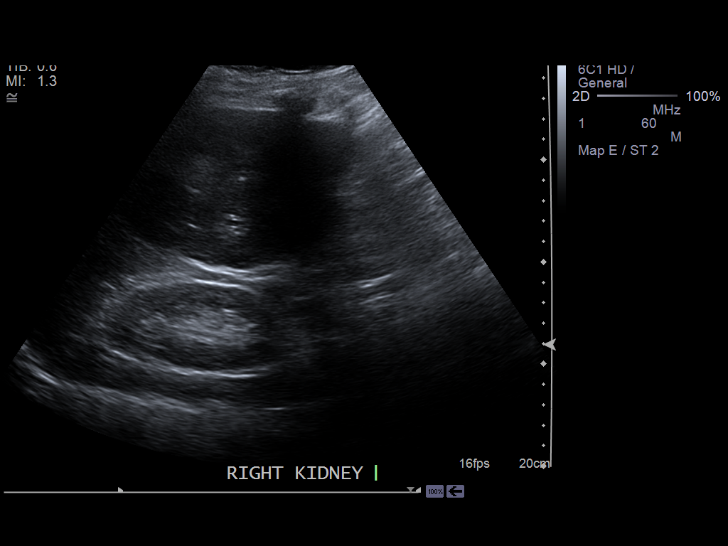
[im 12/31]
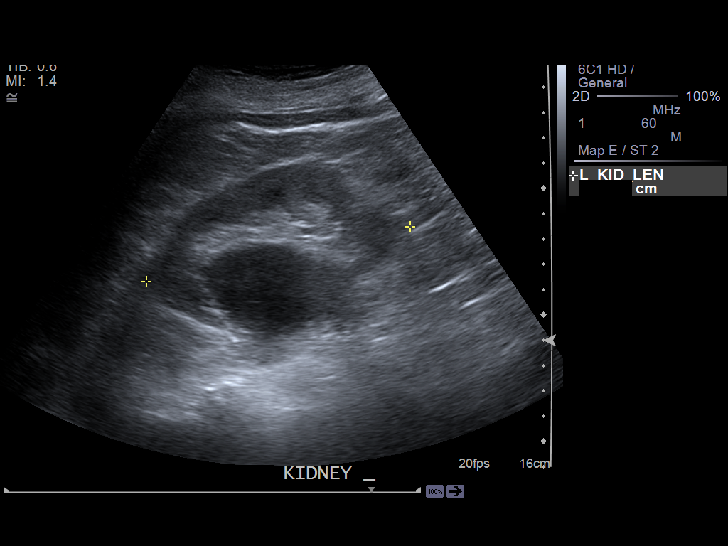
[im 14/31]
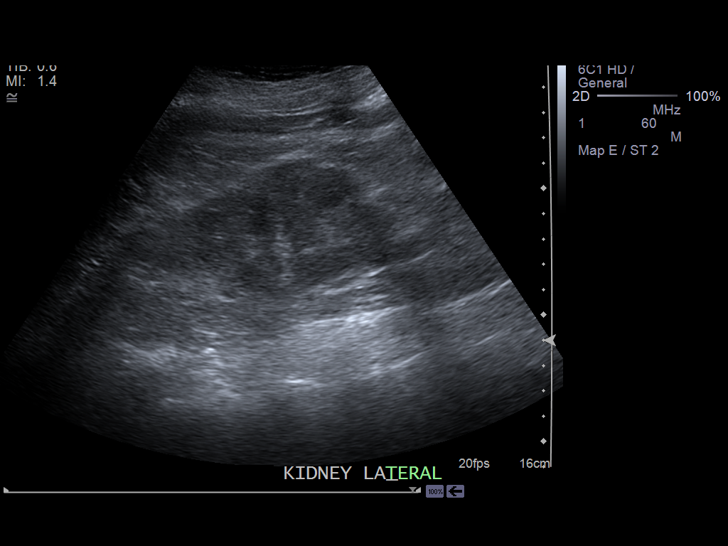
[im 17/31]
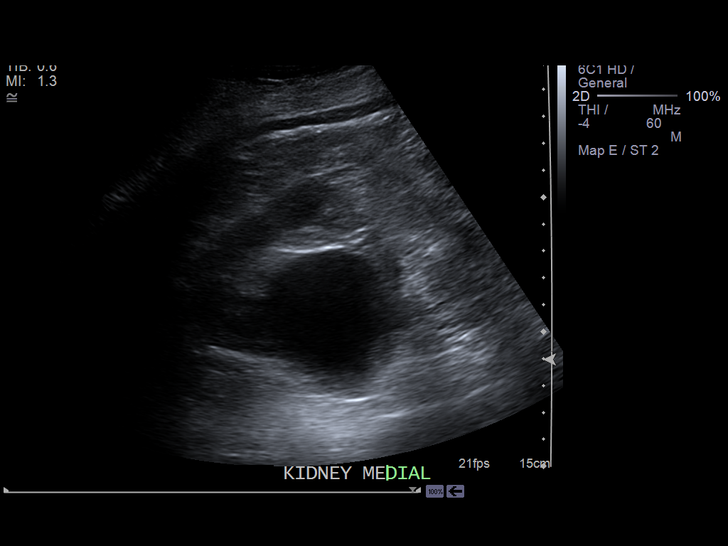
[im 19/31]
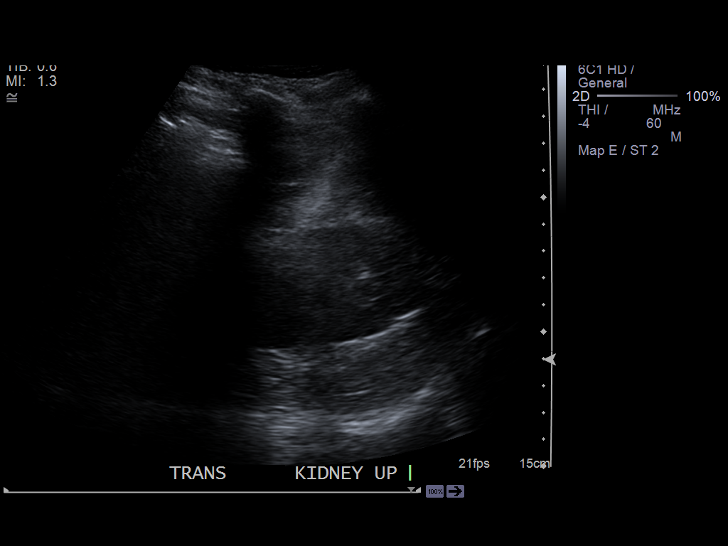
[im 21/31]
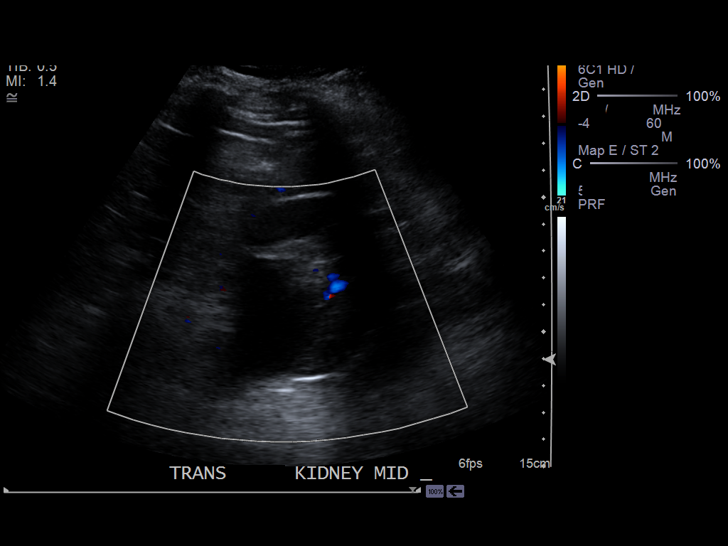
[im 23/31]
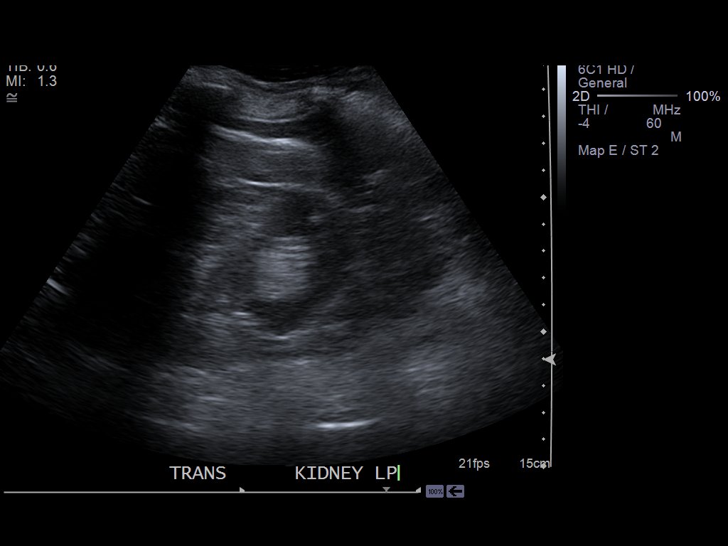
[im 26/31]
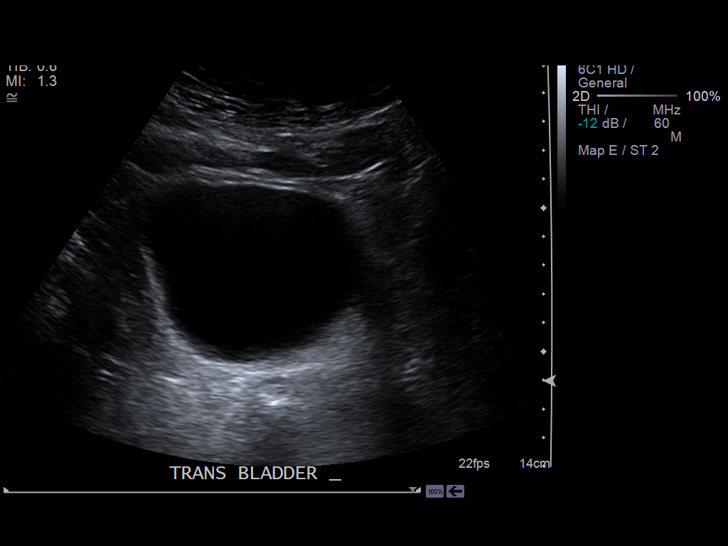
[im 28/31]
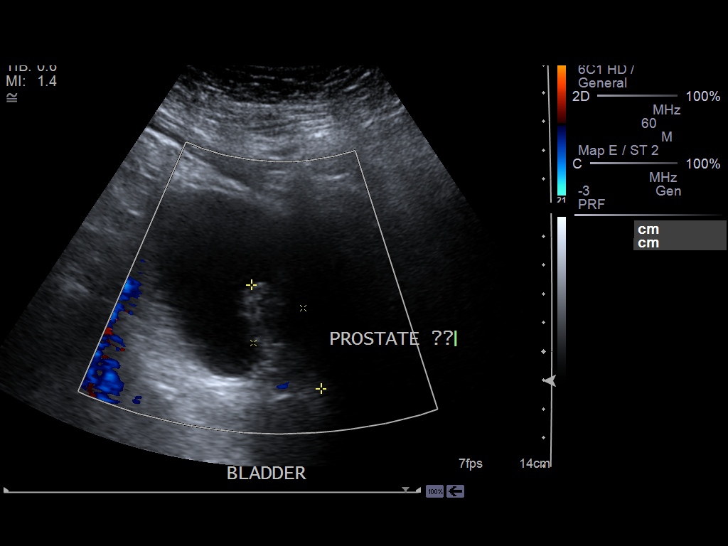
[im 31/31]
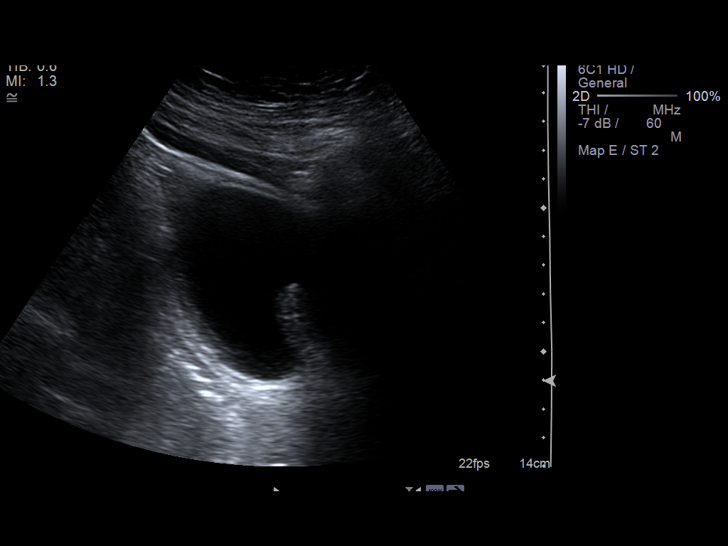

[14 of 25 positions shown; findings below may reference images not displayed]

PROCEDURE:     US  - US KIDNEY  - February 17, 2013  [DATE]

RESULT:     The right kidney measures 9.8 x 5.4 x 4.2 cm. The left kidney
measures 10.6 x 6.7 x 5.1 cm. Associated with the renal pelvis on the left
there is a dominant presumed parapelvic cyst measuring 5.7 cm in greatest
dimension. Neither kidney exhibits hydronephrosis. The echotexture the renal
cortex on the right remains lower than or equal to that of the adjacent
liver. The prostate gland is enlarged and produces a prominent irregular
impression upon the urinary bladder base. Ureteral jets were not
demonstrated.
IMPRESSION: 1. There is a large parapelvic cyst on the left.
2. Neither kidney exhibits evidence of hydronephrosis.
3. An enlarged prostate gland produces a prominent irregular impression upon
the urinary bladder base.

[REDACTED]

## 2014-12-09 NOTE — Discharge Summary (Signed)
PATIENT NAME:  Adam Frank, Adam Frank DATE OF BIRTH:  1931/05/05  DATE OF ADMISSION:  12/12/2012 DATE OF DISCHARGE:  12/17/2012   For previous history and physical and hospitalization, please see the dictated H and P and interim discharge summary dictated by Dr. Nemiah CommanderKalisetti yesterday.  CONSULTANTS:  1.  Dr. Marva PandaSkulskie from GI.  2.  Dr. Cecelia ByarsHashmi from surgery.  3.  Dr. Tedd SiasSolum from endocrine. 4.  Dr. Lorre NickGittin from oncology.  FINAL DIAGNOSES: 1.  Intractable nausea.  2.  Peptic ulcers seen on the esophagogastroduodenoscopy.  3.  Elevated PSA with bone lesions likely related to prostate cancer.  4.  Liver masses possibly from prostate metastases versus another malignancy.  5.  Cholelithiasis without evidence of acute cholecystitis or obstruction.  6.  Hypertension.  7.  Parkinson's disease.  8.  Obstructive sleep apnea and chronic obstructive pulmonary disease.  9.  Secondary hyperparathyroidism.  10.  Vitamin D deficiency.   DISCHARGE MEDICATIONS: 1.  Amlodipine 5 mg daily,. 2.  Metoprolol succinate 25 mg extended-release daily. 3.  Sinemet 2511 tabs 3 times a day.  4.  Spiriva 18 mcg daily.  5.  Ventolin CFC free 90 mcg 2 puffs 4 times a day.  6.  Senna 1 to 2 times a day as needed for constipation.  7.  Docusate sodium 100 mg one cap 2 times a day as needed for constipation.  8.  Pantoprazole 40 mg 2 times a day. 9.  Zofran 4 mg disintegrating tablet every 6 hours as needed for nausea.  10. Vit D 50,000 international units 1 tab every 7 days. DISPOSITION:  The patient will be going home to a rehab.   DIET: Low sodium.  Diet, mechanical soft with pureed meats with aspiration precautions.   ACTIVITY: As tolerated.   FOLLOWUP: Please follow with Dr. Lorre NickGittin in 2 weeks. Please follow with PCP within 2 weeks. Check LFTs on next Tuesday and fax to Dr. Lorre NickGittin at [phone].  HOSPITAL COURSE:  Since yesterday, the patient underwent an MRCP, which showed cholelithiasis without any biliary  distention and the portal vein was patent. There are multiple hepatic lesions which were not definitely simple cystic lesions. This was discussed with Dr. Lorre NickGittin although he initially recommended starting Casodex pills 50 mg daily with follow-up of the LFTs.  It was decided to stop that and not start it until the patient sees Dr. Lorre NickGittin as an outpatient. Liver metastases or possibly prostate metastases, however, another primary is still a possibility,  He has no nausea or vomiting and he has tolerated his advanced diet. The LFTs have improved as well. He should follow with Dr. Lorre NickGittin as an outpatient as described above and at this point, he will be discharged. He was also noted to have vitamin D deficiency and has been started on vitamin D as above.   TIME SPENT: 40 minutes.   CODE STATUS: The patient is a full code.    ____________________________ Krystal EatonShayiq Kolleen Ochsner, MD sa:ct D: 12/17/2012 15:30:00 ET T: 12/17/2012 16:08:02 ET JOB#: [id number]  cc: Krystal EatonShayiq Sharelle Burditt, MD, <Dictator> Knute Neuobert G. Lorre NickGittin, MD Masud S. Cecelia ByarsHashmi, MD Primary Care Physician  Krystal EatonSHAYIQ Olyvia Gopal MD ELECTRONICALLY SIGNED 01/11/2013 15:09

## 2014-12-09 NOTE — Consult Note (Signed)
Brief Consult Note: Diagnosis: PROSTATE CANCER ,BONE METASTASIS, ABNORMAL LIVER FUNCTION, PARKINSONS DISEASE.   Patient was seen by consultant.   Discussed with Attending MD.   Comments: DICTATED NOTE TO FOLLOW   SEEN AND EVALUATED . DISCUSSED WITH DR SOLUM YESTERDAY  DISCUSSED TODAY WITH DR Nemiah CommanderKALISETTI. PSA RESULT AND XRAY AND BONE SCANS EVEN WITHOUT TISSUE DX ARE COMPELLING FOR PROSTATE CANCER. AS VERY DEBILITATED, I FEEL WE CAN SPARE HIM AN INVASIVE PROCEEDURE AT THIS TIME. HAVE RECOMMENDED CT ABDO AND PELVIS TO EVALUATE FOR ADENOPATHY, ENLARGED PROSTATE, AND POSSIBLE SOFT TISSUE MASSES, ADVISED THAT OUT PATIENT SCAN RECENT DONE, AND UNREVEALING. PLAN TO DISCUSS WITH UROLOGY, F/U LFTS, THEN START CASODEX PO 50 MG DAILY, F/U LFT IN 1 AND 2 WEEKS, THEN GIVE LUPRON, MAY NEED UROLOGY F/U. NEEDS REHAB AND NEUROLOGY F/U FOR SIGNIFICSNT IMMOBILITY .LIKELY ALL CONSISTENT WITH PARKINSONS DISEASE, DIFFICULT TO EVALUATE FOR MYELOPATHY.  WHEN EXAMINED HE WAS SLIGHTLY SLUGGISH, ALERT, COOPERATIVE, WITH GENRAL LIMB WEAKNESS.Marland Kitchen.HE HAS GIVEN NURSES A HX OF RIGHT SIDED WEAKNESS BUT HE HAD NO DEFINITE STROKE HX ON CHART, NO CONTRAST HEAD CT WAS NEG. WILL START ON DECADRON AND PLAN FOR MRI SPINE.  Electronic Signatures: Adam Frank, Adam Frank (MD)  (Signed 30-Apr-14 23:59)  Authored: Brief Consult Note   Last Updated: 30-Apr-14 23:59 by Adam Frank, Adam Frank (MD)

## 2014-12-09 NOTE — Consult Note (Signed)
PATIENT NAME:  Adam Frank, Anees MR#:  [id number] DATE OF BIRTH:  12/05/1930  DATE OF CONSULTATION:  12/13/2012  REFERRING PHYSICIAN:  Dr. Nemiah CommanderKalisetti  CONSULTING PHYSICIAN:  Christena DeemMartin U. Jerald Hennington, MD  PRIMARY CARE PHYSICIAN: Dr. Beverely RisenFozia Khan.    REASON FOR CONSULTATION: Intractable nausea, vomiting and dysphagia   HISTORY OF PRESENT ILLNESS:  The patient is an 81 year old African American male who was brought to the hospital with problems of nausea and vomiting and complained of dysphagia. This has been occurring for several months. He did see Dr. Cecelia ByarsHashmi as an outpatient for further evaluation and it is felt that he may need to have his gallbladder out. However, there was a barium swallow done that did have several findings. This did show a prominent gastroesophageal reflux, a small hiatal hernia as well as mild presbyesophagus. I reviewed these films myself and I also feel that there is a very poor apparent peristalsis and perhaps some increased dimension of the caliber of the esophagus, consistent with problems related to his history of Parkinson's. He states that his biggest problem seems to be that of a cough that produces phlegm that is hard to get out of his throat. When he has had episodes of emesis, he has not seen any blood or Coca-Cola or coffee-ground type material. The material that comes up is essentially food. He does have a history of Parkinson's as noted above. He has not found it necessary to induce regurgitation of food that is stuck. He has no previous history of peptic ulcer disease. He denies the use of aspirins or NSAIDs with the exception of a daily 81 mg aspirin. He has a bowel movement about once or twice a week, feels that he does have problems with constipation; however, he takes some type of pill for this. He has not been on MiraLAX in the past. He denies any problems with black stools, blood in the stools, or slimy stools. He thinks he may have had a colonoscopy in the past. On review of  the chart, his last colonoscopy was 01/26/2005. That procedure was done for screening purposes and was found to have diverticulosis throughout the entire colon with no evidence of diverticular bleeding. There was some small, internal, nonbleeding hemorrhoids. There is no evidence of colon polyps. In regards to the nausea and vomiting, the patient states that he is feeling much better, has not had any vomiting today.   GASTROINTESTIONAL FAMILY HISTORY: Negative for colorectal cancer, liver disease or ulcers.   PAST MEDICAL HISTORY: 1.  Parkinson's disease.  2.  Gastroesophageal reflux.  3.  Obstructive sleep apnea for which he does use a CPAP.  4.  Hypertension.  5.  History of appendectomy.  5.  Right leg surgery after a trauma.    OUTPATIENT MEDICATIONS: Amlodipine 5 mg 1 tablet once a day, 81 mg aspirin once a day, metoprolol succinate ER 25 mg once a day, Nexium 40 mg a day, promethazine 12.5 mg twice a day, Sinemet 25/100 mg 1 tablet 3 times a day, Spiriva 18 mcg once a day, Ventolin 2 puffs inhaled 4 times a day.   ALLERGIES: ENALAPRIL AND TOMATO.   PHYSICAL EXAMINATION: VITAL SIGNS: Temperature 97.4, pulse 83, respirations 20, blood pressure 106/66, pulse oximetry 94%.  GENERAL: He is an 81 year old African American male no acute distress.  HEENT: Normocephalic, atraumatic.  Eyes are anicteric.  Nose:  Septum midline. No lesions. Oropharynx: Edentulous.  NECK: Supple. No JVD. No lymphadenopathy. No thyromegaly.  HEART: Regular rate and rhythm.  LUNGS: Clear.  ABDOMEN: Soft, nontender, nondistended. Bowel sounds positive, normoactive.  RECTAL: Anorectal exam is deferred.  EXTREMITIES: No clubbing, cyanosis or edema.  NEUROLOGICAL: Cranial nerves II through XII grossly intact. Muscle strength bilaterally equal and symmetric.   LABORATORY, DIAGNOSTIC, AND RADIOLOGICAL DATA: Include the following:  This morning he had a glucose of 115, BUN 15, creatinine 0.86, sodium 138, potassium 4.1,  chloride 105, bicarbonate 26, calcium 8.7, magnesium 1.7. A hepatic profile shows a total protein of 7.6, albumin 2.0, total bilirubin 2.1, alkaline phosphatase 444, AST 78, ALT 17. Cardiac enzymes x 1 were negative.   Hemogram today shows white count of 7.2, hemoglobin and hematocrit of 9.1/28.5, platelet count 268, MCV 75.   Urinalysis showed 30 mg/dL protein, 5 white cells per high-power field.   He had an abdominal ultrasound that shows cholelithiasis without sonographic evidence of acute cholecystitis. There is no evidence of intra or extrahepatic biliary ductal dilatation and the common bile duct was 3.5 mm in size.   He had a 3-way abdominal film showing no evidence of bowel obstruction. There was sclerosis of the thoracolumbar spine, patchy areas within the pelvis possible secondary to hyperparathyroidism versus malignancy.   He had a CT scan of the head without contrast showing no acute intracranial process. There was no comment in regards to the skull itself, other than orbits unremarkable and paranasal sinuses and mastoid air cells unremarkable.   ASSESSMENT:  Intractable nausea, vomiting and dysphagia. These are likely two different issues for the patient. On previous upper gastrointestinal series, he did show esophageal dysmotility/presbyesophagus as well as finding of possible esophagitis reflux-related. He does show abnormal LFTs consistent with findings on his abdominal ultrasound of cholelithiasis. He does have a certain amount of dysphagia was also consistent with the findings on the barium swallow clinically.   RECOMMENDATIONS: 1.  Would consider a modified barium swallow test in regards to the issues of presbyesophagus.  2.  Possible EGD. The patient seems to be very weak and his performance status is likely to be very poor. This may preclude sedative evaluation.  3.  Would contact Dr. Cecelia Byars to let him know the patient is in the hospital. We will discuss further with Dr. Cecelia Byars.   4.  Continue proton pump inhibitor as you are.   The patient overall is feeling better than on admission.    ____________________________ Christena Deem, MD mus:cc D: 12/13/2012 16:07:30 ET T: 12/13/2012 17:56:00 ET JOB#: [id number]  cc: Christena Deem, MD, <Dictator> Christena Deem MD ELECTRONICALLY SIGNED 12/29/2012 13:02

## 2014-12-09 NOTE — Consult Note (Signed)
PATIENT NAME:  Adam Frank, Jhamir MR#:  [id number] DATE OF BIRTH:  28-Jun-1931  DATE OF CONSULTATION:  12/15/2012  REFERRING PHYSICIAN:  Enid Baasadhika Kalisetti, MD CONSULTING PHYSICIAN:  A. Wendall MolaMelissa Solum, MD  CHIEF COMPLAINT: Elevated PTH, questionable hyperparathyroidism.   HISTORY OF PRESENT ILLNESS: This is an 81 year old male seen in consultation at the request of Dr. Nemiah CommanderKalisetti for an elevated PTH level. His chart was reviewed. The patient is somewhat of a poor historian. Apparently he has had several months of nausea, vomiting, poor p.o. intake and weight loss. He has become progressively weak, which resulted in his hospitalization on April 26. He underwent a barium swallow notable for prominent gastroesophageal reflux and a small hiatal hernia as well as mild presbyesophagus. Lab work has been notable for elevated alkaline phosphatase, calcium of 8.7 to 9.1 with a corresponding albumin in the range of 1.5 to 2.0, with a corrected calcium for hypoalbuminemia in the range of 10.5 to 10.7, elevated PTH of 76 and an elevated PSA of 427. Yesterday, the patient underwent a bone scan which showed diffuse skeletal uptake in the ribs, sternum, proximal humerus and throughout the spine and pelvis. Alkaline phosphatase has been in the range of 426 to 444 and he has had mild elevation of his bilirubin in the range of 1/7 to 2.0. EGD performed earlier today by Dr. Cecelia ByarsHashmi was notable for a medium-sized hiatal hernia, 2 small gastric polyps, nonbleeding linear gastric ulcers, with an otherwise unremarkable study. The patient has been scheduled for laparoscopic cholecystectomy for tomorrow morning.   The patient denies any discomfort at this time. He specifically denies any bony pain. He reports appetite is decreased. He denies nausea at this time. He also denies any abdominal pain.   PAST MEDICAL HISTORY:  1.  Hypertension.  2.  GERD.  3.  Parkinson disease.  4.  OSA on CPAP.   PAST SURGICAL HISTORY: Appendectomy.    ALLERGIES: ENALAPRIL.   SOCIAL HISTORY: The patient is married. History of tobacco use, quit 20 years ago. No alcohol use.   FAMILY HISTORY: The patient denies any known medical problems in the family including parents and siblings.   MEDICATIONS:  1.  Norvasc 5 mg daily.  2.  Carbidopa/levodopa 25/100, 1 tab t.i.d.  3.  Toprol-XL 25 mg daily.  4.  Protonix 40 mg b.i.d.  5.  Albuterol 2 puffs q.4 hours.  6.  Spiriva inhaler 1 puff daily.  7.  Normal saline 0.9 at 60 mL/h.   PHYSICAL EXAMINATION:  VITAL SIGNS: Height 73.9 inches, weight 252 pounds, BMI 32.4. Temperature 98, pulse 80, respirations 20, blood pressure 113/73, pulse oximetry 91%.  GENERAL: African American male in no acute distress.  HEENT: EOMI. Oropharynx is clear. Mucous membranes moist.  NECK: Supple. No appreciable thyromegaly or neck mass.  CARDIAC: Regular rate and rhythm. No carotid bruit.  PULMONARY: Clear bilaterally. No wheeze.  ABDOMEN: Diffusely soft, nontender, nondistended.  EXTREMITIES: No edema is present. There is a bony deformity of the left lower extremity due to a prior fracture.  NEUROLOGIC: Nonfocal.  PSYCHIATRIC: Alert, oriented.   ADDITIONAL LABS: On 12/14/2012, albumin 1.5, total bilirubin 1.7, direct bilirubin 1.0, AST 90, ALT less than 6. Hemoglobin 9.1, hematocrit 28.5, WBC 7.2, platelets 268.   RADIOLOGY: Right upper quadrant ultrasound on 12/12/2012 showed cholelithiasis. CBD measured 3.5 mm. No gallbladder wall thickening, pericholecystic fluid or sonographic Murphy's sign. Liver noted to be without evidence of a focal hepatic lesion, however, with increased echogenicity as seen in hepatic steatosis.  ASSESSMENT: An 81 year old male with malaise, weight loss, elevated PSA and diffuse bony uptake on a recent bone scan, as well as elevated alkaline phosphatase, all consistent with metastatic prostate cancer with bony metastases. While his PTH is mildly elevated in the setting of a mildly  elevated serum corrected calcium suggesting mild hyperparathyroidism, this is mild enought that it would not readily explain the increased diffuse uptake as seen on bone scan; therefore, metastatic prostate cancer is much more likely. Causes of hyperparathyroidism may be primary versus secondary hyperparathyroidism. Will assess for causes of secondary hyperparathyroidism such as vitamin D deficiency.   RECOMMENDATIONS:  1.  Given my concerns of prostate cancer, I recommend oncology consultation. I discussed the case with Dr. Lorre Nick who agreed to see the patient.  2.  Will obtain a 25-hydroxy vitamin D level with labs.  3.  Will replace his mildly low magnesium and follow periodically, as well as repeat a PTH level in a few days.  4.  I will follow along with you as labs are resulted.   Thank you for the kind request for consultation.   ____________________________ A. Wendall Mola, MD ams:jm D: 12/15/2012 17:26:30 ET T: 12/15/2012 18:38:55 ET JOB#: [id number]  cc: A. Wendall Mola, MD, <Dictator> Macy Mis MD ELECTRONICALLY SIGNED 12/16/2012 13:15

## 2014-12-09 NOTE — Consult Note (Signed)
Chief Complaint:  Subjective/Chief Complaint Pt feels well. Tolerating diet well.  Denies nausea or vomiting.  Denies abdominal pain.  Hgb dropped from 9.1 to 7.6.  Denies rectal bleeding or melena.  ALP, bilirubin slowly declining.   VITAL SIGNS/ANCILLARY NOTES: **Vital Signs.:   12-Jun-14 04:06  Vital Signs Type Routine  Temperature Temperature (F) 97.6  Celsius 36.4  Temperature Source oral  Pulse Pulse 102  Respirations Respirations 20  Systolic BP Systolic BP 035  Diastolic BP (mmHg) Diastolic BP (mmHg) 77  Mean BP 88  Pulse Ox % Pulse Ox % 94  Pulse Ox Activity Level  At rest  Oxygen Delivery Room Air/ 21 %   Brief Assessment:  GEN well developed, well nourished, no acute distress, A/O x2   Cardiac Regular   Respiratory normal resp effort   Gastrointestinal details normal Soft  Nontender  Nondistended  Bowel sounds normal  No rebound tenderness  No gaurding   EXTR negative edema   Additional Physical Exam Skin: Warm & dry   Lab Results: Thyroid:  12-Jun-14 04:01   Thyroid Stimulating Hormone 0.526 (0.45-4.50 (International Unit)  ----------------------- Pregnant patients have  different reference  ranges for TSH:  - - - - - - - - - -  Pregnant, first trimetser:  0.36 - 2.50 uIU/mL)  Hepatic:  12-Jun-14 04:01   Bilirubin, Total  1.1  Alkaline Phosphatase  318  SGPT (ALT)  < 6  SGOT (AST)  41  Total Protein, Serum  6.3  Albumin, Serum  1.8  Routine Chem:  12-Jun-14 04:01   Glucose, Serum 91  BUN 15  Creatinine (comp) 0.81  Sodium, Serum 137  Potassium, Serum 3.7  Chloride, Serum 105  CO2, Serum 23  Calcium (Total), Serum 8.9  Osmolality (calc) 274  eGFR (African American) >60  eGFR (Non-African American) >60 (eGFR values <58m/min/1.73 m2 may be an indication of chronic kidney disease (CKD). Calculated eGFR is useful in patients with stable renal function. The eGFR calculation will not be reliable in acutely ill patients when serum creatinine  is changing rapidly. It is not useful in  patients on dialysis. The eGFR calculation may not be applicable to patients at the low and high extremes of body sizes, pregnant women, and vegetarians.)  Anion Gap 9  Routine Hem:  12-Jun-14 04:01   WBC (CBC) 6.9  RBC (CBC)  3.08  Hemoglobin (CBC)  7.6  Hematocrit (CBC)  24.2  Platelet Count (CBC) 272  MCV  79  MCH  24.8  MCHC  31.5  RDW  23.3  Neutrophil % 74.0  Lymphocyte % 17.8  Monocyte % 7.0  Eosinophil % 0.2  Basophil % 1.0  Neutrophil # 5.1  Lymphocyte # 1.2  Monocyte # 0.5  Eosinophil # 0.0  Basophil # 0.1 (Result(s) reported on 28 Jan 2013 at 05:03AM.)   Assessment/Plan:  Assessment/Plan:  Assessment N/V: resolved PUD: On BID PPI.   Anemia:  Hgb dropped from 9.1 to 7.6.  No evidence of GI bleed.  ? secondary to hemodilution ?casodex.  Will discuss further with Dr WAllen Norris Esophageal motility disorder/Presbyesophagus:  No complaints at present from pt.  Denies odynophagia. ALP/bili elevation:  Likely due to casodex.   Plan 1) Continue BID Protonix 441m2) Monitor for gross GI bleeding 3) Continue supportive measures including antiemetics & palliative care 4) Advance to Dysphagia diet as tolerated   Electronic Signatures: JoAndria MeuseNP)  (Signed 12-Jun-14 10:13)  Authored: Chief Complaint, VITAL SIGNS/ANCILLARY NOTES, Brief Assessment, Lab  Results, Assessment/Plan   Last Updated: 12-Jun-14 10:13 by Andria Meuse (NP)

## 2014-12-09 NOTE — H&P (Signed)
PATIENT NAME:  Adam Frank, MANUS MR#:  [id number] DATE OF BIRTH:  12/16/1930  DATE OF ADMISSION:  01/27/2013  ADMITTING PHYSICIAN: Enid Baas, M.D.   PRIMARY CARE PHYSICIAN: Beverely Risen, MD   PRIMARY ONCOLOGIST: Dr. Lorre Nick  CHIEF COMPLAINT: A syncopal episode, intractable nausea and vomiting.   HISTORY OF PRESENT ILLNESS: The patient is an 81 year old African American male with past medical history significant for chronic obstructive pulmonary disease, obstructive sleep apnea on home oxygen, hypertension, Parkinson disease, gastroesophageal reflux disease, who was recently diagnosed with metastatic prostate cancer about a month ago and he is being following with Dr. Lorre Nick, comes in with the above-mentioned complaints. The patient was admitted here about 5  weeks ago with intractable nausea and vomiting. He was seen by GI, had upper GI endoscopy done which showed small but stable ulcers, but he did have some gallstones, so his nausea and vomiting was thought secondary to be his gallbladder issues. However, he was found to have elevated AST and parathormone levels. Bone scan revealed multiple bone lesions an elevated PSA. He was diagnosed without a tissue biopsy with prostate cancer and he is started on Casodex and is following with Dr. Lorre Nick and is in the process of being started on hormonal shots. Four weeks ago, he was discharged from the hospital at that time to a rehab and did okay according to the wife. He still had ongoing nausea and occasional vomiting at that time, was just discharged yesterday. Since he came home, he was extremely weak, mostly in bed continued to have nausea and greenish bilious vomitus not related to eating.  He went to use the restroom this morning and sat on the commode and as soon as he stood up, he passed out, so was brought to the hospital.   PAST MEDICAL HISTORY: 1. Intractable not yet one likely related to either liver metastasis or gallstones.  2. Hypertension.   3. Obstructive sleep apnea and chronic obstructive pulmonary disease, on home oxygen.  4. Parkinson's disease.  5. Gastroesophageal reflux disease.  6. Metastatic prostate cancer.   PAST SURGICAL HISTORY:  1. Appendectomy.  2. Right leg surgery after trauma.   ALLERGIES TO MEDICATIONS: ENALAPRIL AND TOMATO ALLERGY.   HOME MEDICATIONS: 1. Casodex 15 mg daily.  2. Metoclopramide 5 mg p.o. b.i.d. as needed for nausea.  3. Mirtazapine 15 mg p.o. at bedtime.  4. Zofran 4 mg p.o. q. 6 hours p.r.n. for nausea or vomiting.  5. Promethazine 12.5 mg p.o. b.i.d. before meals as needed for nausea.  6. ProAir inhaler 2 puffs 4 times a day as needed for shortness of breath.  7. Sinemet 25/100 mg 1 tablet p.o. t.i.d.  8. Spiriva 18 mcg capsule inhalation daily.   SOCIAL HISTORY: Lives at home with his wife was recently discharged from rehab. Quit smoking several years ago. No alcohol use. He uses a walker at baseline, and needs 1 to 2 person assist for minimal activities.   FAMILY HISTORY: Mom died from old age and dad had a stroke.   REVIEW OF SYSTEMS:   CONSTITUTIONAL: Positive for fatigue and weakness. No fevers.   EYES: No blurred vision, double vision, glaucoma, pain, inflammation. Positive for history of cataracts.   ENT: Positive for moderate hearing loss. No tinnitus, ear pain, epistaxis or discharge.   RESPIRATORY: Positive for cough when eating. No wheeze, hemoptysis or chronic obstructive pulmonary disease.   CARDIOVASCULAR: No chest pain, orthopnea, edema, arrhythmia, palpitations or syncope.   GASTROINTESTINAL: Positive for nausea, vomiting and  also severe constipation. No diarrhea, abdominal pain. No rectal bleed or jaundice.   GENITOURINARY: No dysuria, hematuria, renal calculus, frequency or incontinence.   ENDOCRINE: No polyuria, nocturia, thyroid problems, heat or cold intolerance.   HEMATOLOGY: No anemia, easy bruising or bleeding.   SKIN: No acne, rash or lesions.    MUSCULOSKELETAL: Positive for arthritis. No gout.   NEUROLOGIC: No numbness, weakness, CVA, transient ischemic attack or seizures.   PSYCHOLOGIC: No anxiety, insomnia, depression.   PHYSICAL EXAMINATION: VITAL SIGNS: Temperature 97 degrees Fahrenheit, pulse 97, respirations 18, blood pressure 104/54, pulse oximetry 98% on room air.   GENERAL: Well-built, well-nourished male lying in bed, not in any acute distress.   HEENT: Normocephalic, atraumatic. Pupils equal, round, reacting to light. Anicteric sclerae. Extraocular movements intact. Mucous membranes, oropharynx otherwise clear without erythema, mass or exudates. No oral thrush is seen on either the tongue or posterior pharyngeal wall.   NECK: Supple. No thyromegaly, JVD or carotid bruits. No lymphadenopathy.   LUNGS: Clear to auscultation bilaterally. Decreased bibasilar breath sounds. No wheeze or crackles. No use of accessory muscles for breathing.   CARDIOVASCULAR: S1, S2 regular rate and rhythm, 2/6 systolic murmur heard. No rubs or gallops.   ABDOMEN: Soft, nontender, nondistended. No hepatosplenomegaly. Normal bowel sounds.   EXTREMITIES: No pedal edema.  Deformed right leg just above the ankle from his prior surgery, 2+ dorsalis pedis pulses palpable bilaterally.   SKIN: No acne, rash or lesions.   LYMPHATICS: No cervical or inguinal lymphadenopathy.   NEUROLOGIC: Cranial nerves II through XII are intact. Motor strength is 5/5 in the upper extremities and no sensory deficits noted.   PSYCHOLOGICAL: The patient is awake, alert, oriented x 3. Normal affect and mood.   LABORATORY, DIAGNOSTIC AND RADIOLOGICAL DATA:  Urinalysis is negative for any infection. WBC 8.0, hemoglobin 9.1, hematocrit 28.4, platelet count is 330. Sodium 137, potassium 3.8, chloride 101, bicarbonate 23, BUN 19, creatinine 1.08, glucose 97, calcium 9.3. ALT less than 6, AST 40, alkaline phosphatase 339, total bilirubin 1.3 and albumin of 3.1. Cardiac  enzymes are negative. Lipase is 65. CT of the head showing no acute abnormality. Diffuse cerebral and cerebellar atrophy is present. EKG showing normal sinus rhythm, heart rate of 99.   ASSESSMENT AND PLAN: An 81 year old male with past medical history significant for metastatic prostate cancer, history of nausea and vomiting last month as well and admission for the same, dementia, Parkinson's disease; was brought in secondary to a syncopal episode and then, intractable nausea and vomiting. 1. Intractable nausea and vomiting associated with some odynophagia now, seen by Gastroenterology last admission, had EGD done showing bleeding gastric ulcer.  No esophagitis at the time, now with recurrent symptoms. We will reconsult gastroenterology, it could be firm liver mass or gallstones. Continue IV fluids, Zofran, Phenergan p.r.n. and Protonix IV.  2. Syncope secondary to dehydration and poor p.o. intake. We will start IV fluids. The patient is monitored on off unit telemetry. We will check carotid Dopplers. CT of the head shows severe cerebral and cerebellar atrophy.  3. Chronic obstructive pulmonary disease. Stable. Continue home oxygen and also Spiriva inhaler.  4. Prostate cancer possibly metastatic prostate cancer per Dr. Paula ComptonGittin's note.  Diagnosed in 12/2012. Bone scan positive at that time.  Started on Casodex p.o. and plan to start Nuprin shots as well.  We will get oncology consult and also palliative care consult. 5. Gastrointestinal and deep vein thrombosis prophylaxis on Protonix and heparin subcutaneous as needed.   CODE  STATUS: Full code.   TIME SPENT ON ADMISSION: 50 minutes.    ____________________________ Enid Baas, MD rk:rw D: 01/27/2013 14:57:21 ET T: 01/27/2013 15:42:37 ET JOB#: [id number]  cc: Enid Baas, MD, <Dictator> Knute Neu. Lorre Nick, MD Lyndon Code, MD Enid Baas MD ELECTRONICALLY SIGNED 02/09/2013 15:17

## 2014-12-09 NOTE — Consult Note (Signed)
Brief Consult Note: Diagnosis: n/v dysphagia.   Patient was seen by consultant.   Consult note dictated.   Recommend further assessment or treatment.   Comments: Please see full GI consult [phone]#[id number].  Patient admitted woth n/v and chronic problems of dysphagia.  Patient showing cholelithiasis and lfts consistant with cholecystitis.  Recent barium swallow showing poor motility in the esophagus with mild changes of presbyesophagus, likely worsened by his baseline problems  of Parkinsons disease.  Recommend doing a modified barium swallow and make Dr Verlon AuHashimi aware that patietn is in the hospital.  I will discuss egd further with him, although patient performance seems poor.  Patietn feeling better with current medical treatment, continue current.  Following.  Electronic Signatures: Barnetta ChapelSkulskie, Martin (MD)  (Signed 27-Apr-14 16:11)  Authored: Brief Consult Note   Last Updated: 27-Apr-14 16:11 by Barnetta ChapelSkulskie, Martin (MD)

## 2014-12-09 NOTE — H&P (Signed)
PATIENT NAME:  Adam Frank, Adam Frank MR#:  [id number] DATE OF BIRTH:  [birth year]  DATE OF ADMISSION:  02/16/2013  PRIMARY CARE PHYSICIAN:  Dr. Lorre Nick.   HISTORY OF PRESENT ILLNESS:  The patient is an 81 year old African American male with past medical history significant for history of disseminated prostate carcinoma with metastasis to liver as well as bone who was admitted to our hospital from 01/27/2013 to 02/01/2013 with intractable nausea and vomiting, comes back with the same complaints of intractable nausea, vomiting, as well as abdominal pains.  Apparently patient has been having pains for the past two weeks now.  He is not tolerating any solid diet, however admits to able to drink some fluids.  On arrival to the Emergency Room, he was noted to be dehydrated, was in acute renal failure with creatinine of 1.67 and hospitalist services were contacted for admission.   PAST MEDICAL HISTORY:  Significant for a history of metastatic prostate carcinoma, metastasis to liver, bone, intractable pain, which was felt to be related to liver metastasis, hypertension, obstructive sleep apnea, chronic obstructive pulmonary disease on home oxygen therapy, Parkinson's disease, gastroesophageal reflux disease, status post appendectomy, status post right leg surgery after trauma.   ALLERGIES:   ENALAPRIL AS WELL AS TOMATO.   MEDICATIONS:  According to medical records, the patient is on multiple medications including Amitiza 25 mcg by mouth twice daily, bicalutamide 50 mg by mouth once daily, iron sulfate 220 mg in 5 mL solution, 5 mL 3 times daily with meals, granisetron 1 mg twice daily, haloperidol 1 mg 3 times daily, magnesium oxide 400 mg by mouth twice daily, Remeron 15 mg by mouth at bedtime, polyethylene glycol 17 mg by mouth once daily as needed, potassium chloride 20 mEq by mouth daily at bedtime, ProAir HFA 2 puffs 4 times daily as needed, promethazine 25 mg injectable solution every 6 hours as needed, Protonix 40  mg by mouth twice daily, Reglan 10 mg 3 times daily, Sinemet 25/100 1 tablet 3 times daily, Spiriva 18 mcg inhalation daily, transdermal scopolamine patch 1.5 mg every 72 hours, vitamin D2 50,000 units once weekly and Zofran 4 mg 3 times daily.   SOCIAL HISTORY:  He lives in [facility] now, used to live with his wife.  Quit smoking several years ago.  No alcohol abuse.  He used a walker at baseline, however recently needed much more assist for minimal activities.   FAMILY HISTORY:  The patient's mother died of old age and the patient's father had a stroke.    REVIEW OF SYSTEMS:  Not available as the patient is very difficult to understand, however he denies any chest pains, shortness of breath or cough.  He admits of abdominal pain which he shows to right upper quadrant, not able to elaborate much more.  Denies any dysuria symptoms.  I am not really sure if he is emptying his bladder well because he admits that he has some issues with bladder emptying, otherwise:  CONSTITUTIONAL:  Denies any fevers, chills.  Not able to assess him for fatigue or ask him about fatigue.  He shows me where his pain is in the right upper quadrant.  No weight loss or gain.  I am not able to get any history about that.  EYES:  Denies any blurry vision, double vision or glaucoma.  EARS, NOSE, THROAT:  Denies any tinnitus, allergies, epistaxis, sinus pain, dentures, difficulty swallowing.   RESPIRATORY:  Denies any wheezes, asthma, COPD.  CARDIOVASCULAR:  Denies any chest pains,  orthopnea, arrhythmias.  GASTROINTESTINAL:  Denies any diarrhea, rectal bleeding or constipation.  GENITOURINARY:  Denies dysuria, hematuria, frequency or incontinence.  ENDOCRINOLOGY:  Denies any polydipsia, nocturia, thyroid problems, heat or cold intolerance or thirst. HEMATOLOGY:  Denies any anemia, easy bruising, bleeding or swollen glands.  SKIN:  Denies any acne, rashes, lesions, change in moles.  MUSCULOSKELETAL:  Denies arthritis,  cramps, swelling.  NEUROLOGIC:  No numbness, epilepsy or tremor.  PSYCHIATRY:  Denies anxiety, insomnia.   PHYSICAL EXAMINATION:  VITAL SIGNS:  On arrival to the hospital, temperature is 98.9, pulse 130, respiration was 28, blood pressure 98/45, saturation was 97% on room air.  GENERAL:  This is a well-developed, well-nourished African American male in moderate distress due to nausea and gagging, lying sideways on the stretcher.  HEENT:  His pupils are equal, reactive to light.  Extraocular movements intact.  No icterus.  No conjunctivitis.  He has normal hearing.  The patient does have some slurring of speech.  NECK:  Thyroid is not enlarged.  No adenopathy.  No JVD or carotid bruits bilaterally.  Full range of motion.  LUNGS:  Clear to auscultation all fields anteriorly.  No rales, rhonchi, diminished breath sounds or wheezing.  No labored inspirations, increased effort, dullness to percussion or overt respiratory distress.  CARDIOVASCULAR:  S1, S2 appreciated.  No murmurs, gallops or rubs were noted.  PMI not lateralized.  Chest is nontender to palpation.  1+ pedal pulses.  Trace to 1+ lower extremity edema, but no calf tenderness or cyanosis was noted.  ABDOMEN:  Soft, nontender.  Bowel sounds are present.  No hepatosplenomegaly or masses were noted.  The patient tells me that his abdomen intermittently is tender, however not tender during my palpation. RECTAL:  Deferred.    MUSCULOSKELETAL:  Not able to provide.  He requires two person assistance to getting him upright sitting on the stretcher.  No cyanosis, no kyphosis.  Gait is not tested.  SKIN:  No skin rashes, lesions, erythema, nodularity or induration.  It was warm and dry to palpation.  LYMPHATIC:  No adenopathy in the cervical region.  NEUROLOGICAL:  Cranial nerves grossly intact.  The patient does have dysarthria.  He is alert, oriented, cooperative, but memory is impaired.  He is intermittently agitated and somewhat depressed.    LABORATORY DATA:  BMP showed glucose of 109, BUN and creatinine were 33 and 1.67, otherwise BMP was unremarkable.  CK level was low at 14, magnesium high at 2.5.  Estimated GFR was 44.  Gap is elevated to 21.  Liver enzymes:  Total protein 6.1, albumin 1.7, total bilirubin 1.3, alkaline phosphatase 437, AST 96 and ALT were 11.  Cardiac enzymes were negative.  White blood cell count is elevated to 16.9, hemoglobin 8.8, platelet count 194, absolute neutrophil count is not checked.  Urinalysis, yellow hazy urine, 50 mg/dL of glucose, 2+ bilirubin, trace ketones.  Specific gravity 1.029, pH was 5.0, 1+ blood, 30 mg/dL protein, negative for nitrites or leukocyte esterase, 1 red blood cells, 5 white blood cells, 1+ bacteria, 1 epithelial cell, mucus is present as well as 8 hyaline casts.  ABGs were done on room air, showed pH of 7.37, pCO2 was 21, pO2 was 10.7.   ASSESSMENT AND PLAN: 1.  Acidosis, questionable due to systemic inflammatory response syndrome due to sepsis.  Admit the patient to the medical floor.  Continue the patient on IV fluids.  At this time the patient has lactic acidosis and very likely is due  to hypoperfusion.  We will continue high rate IV fluids and we will follow his condition.  2.  Acute renal failure.  We will continue IV fluids.  We will follow the patient's urine output.  We will also check bladder scan and make sure that he has good urinary output.  3.  Questionable urinary tract infection.  We will initiate the patient on Rocephin after blood cultures are taken as well as urine cultures are taken.  4.  Nausea and vomiting.  We will initiate on IV medications and follow his oral intake.  We will start the patient on clear liquid diet.  5.  Dehydration.  As above, we will continue IV fluids.  6.  Elevated transaminases, seem to be stable, somewhat a little worse from February 16, 2013, however that very likely is due to metastasis to liver.  We will continue pain medications as the  patient tolerates.  We will also get palliative care involved.  7.  Leukocytosis.  We will initiate antibiotic therapy.  We will follow cultures.   TIME SPENT:  50 minutes.    ____________________________ Katharina Caperima Mariusz Jubb, MD rv:ea D: 02/16/2013 22:09:42 ET T: 02/16/2013 23:00:54 ET JOB#: [id number]  cc: Katharina Caperima Ciro Tashiro, MD, <Dictator> Knute Neuobert G. Lorre NickGittin, MD Katharina CaperIMA Cillian Gwinner MD ELECTRONICALLY SIGNED 02/25/2013 11:39

## 2014-12-09 NOTE — Consult Note (Signed)
   Comments   Dr Phifer and I met with pt's wife. Wife updated. She feels that patient is declining and may be approaching end of life. She wants to focus on quality of living and comfort at this point. We discussed various options for his care and arrived at the following plan: stop casodex to see if this helps improve N/V continue aggressive antiemetic regimen and ensure pt's comfortgive pt a few days (ie. over the weekend) to see if he improvesIf there is improvement in pt's condition, Dr Cynda Acres can see him upon his return to evaluate for ongoing treatmentIf pt continues to decline, wife would be agreeable with hospice carewill ask oncology to follow in Dr Golda Acre absence  25 minutes  Electronic Signatures for Addendum Section:  Phifer, Izora Gala (MD) (Signed Addendum 03-Jul-14 12:21)  Billey Chang, NP, and I met with pt's wife. Plan as outlined in above note.   Electronic Signatures: Borders, Kirt Boys (NP)  (Signed 03-Jul-14 11:09)  Authored: Palliative Care   Last Updated: 03-Jul-14 12:21 by Phifer, Izora Gala (MD)

## 2014-12-09 NOTE — H&P (Signed)
PATIENT NAME:  Adam Frank, Adam Frank MR#:  [id number] DATE OF BIRTH:  1931-06-17  DATE OF ADMISSION:  12/12/2012  ADMITTING PHYSICIAN:  Gladstone Lighter, MD  PRIMARY CARE PHYSICIAN:  Clayborn Bigness, MD   CHIEF COMPLAINT:  Intractable nausea, vomiting and generalized weakness.   HISTORY OF PRESENT ILLNESS:   The patient is an 81 year old African American male with past medical history significant for obstructive sleep apnea, hypertension, Parkinson's disease and recently diagnosed with severe gastroesophageal reflux disease, was brought to the hospital secondary to intractable nausea and vomiting triggered by eating for the past 2 months but worsened over the last week. The patient is slow to speak due to his Parkinson's disease and also having trouble with the memory so most of the history is obtained by wife who is at bedside. According to wife, the patient had pneumonia and treated as an outpatient in 09/2012, and since then he is now having some nausea, vomiting, especially on eating. Wife also complains of coughing at times after vomiting. He was referred to see Dr. Phylis Bougie from [facility] to see if cholecystectomy can improve his symptoms. After listening to his symptoms Dr. Phylis Bougie wanted to proceed with an EGD initially prior to cholecystectomy and he scheduled if for 01/10/2013. But since his symptoms got worse over the past week, the patient is not able to eat or drink anything and became extremely weak requiring 1 to 2 person assist at home so he was brought to the hospital for early intervention. The patient also had a barium swallow done as an outpatient last month, which showed severe gastroesophageal reflux disease problem.  PAST MEDICAL HISTORY:  1.  Hypertension.  2.  Obstructive sleep apnea on CPAP.  3.  Parkinson disease. 4.  Gastroesophageal reflux disease.    PAST SURGICAL HISTORY:  1.  Appendectomy.  2.  Right leg surgery after trauma.   ALLERGIES TO MEDICATIONS:  ENALAPRIL AND  TOMATO ALLERGY.   CURRENT HOME MEDICATIONS:  1.  Norvasc 5 mg p.o. daily.  2.  Toprol 25 mg p.o. daily.  3.  Spiriva inhaler 1 capsule daily.  4.  Ventolin 2 puffs q.4 to 6 hours p.r.n. for breathing.  5.  Aspirin 81 mg p.o. daily.  6.  Nexium 40 mg p.o. daily.  7.  Sinemet 25/100 mg tablet 1 tablet p.o. 3 times a day.  SOCIAL HISTORY:  Lives at home with wife. Son and daughter also live with them. Quit smoking more than 20 years ago. No alcohol use. At baseline, he used to walk with a walker to  short distances; however, over the past week extremely weak, mostly bedbound and requiring 1 to 2 person assist even for minimal activities.   FAMILY HISTORY:  Mom died of old age and dad with stroke.   REVIEW OF SYSTEMS:  CONSTITUTIONAL:  Positive for fatigue and weakness. No fevers.  EYES:  No blurred vision, double vision, pain, inflammation, glaucoma or cataracts.  ENT:  Positive for hearing loss. No tinnitus, ear pain, epistaxis or discharge.  RESPIRATORY:  Positive for cough on eating. No wheeze, hemoptysis or COPD.  CARDIOVASCULAR:  No chest pain, orthopnea, edema, arrhythmia, palpitations or syncope.  GASTROINTESTINAL:   Positive for nausea, vomiting and also to severe constipation. No diarrhea. No abdominal pain, no rectal bleed or jaundice.  GENITOURINARY:  No dysuria, hematuria, renal calculus, frequency or incontinence.  ENDOCRINE:  No polyuria, nocturia, thyroid problems, heat or cold intolerance.  HEMATOLOGY:  No anemia, bruising or bleeding.  SKIN:  No acne, rash or lesions.  MUSCULOSKELETAL:  No neck, back, shoulder pain.  MUSCULOSKELETAL:  Positive for arthritis, no gout.  NEUROLOGIC:  No numbness, weakness, CVA, TIA or seizures.  PSYCHOLOGICAL:  No anxiety, insomnia or depression.   PHYSICAL EXAMINATION:  VITAL SIGNS:  Temperature 97.4 degrees Fahrenheit, pulse 101, respirations 20, blood pressure 104/60, pulse ox 97% on room air.  GENERAL:  A well-built, well-nourished  male lying in bed, not in any acute distress.  HEENT:  Normocephalic, atraumatic. Pupils equal, round, reacting to light. Anicteric sclerae. Extraocular movements intact. Dry mucous membranes. Oropharynx clear without erythema, mass or exudates.  NECK:  Supple. No thyromegaly, JVD or carotid bruits. No lymphadenopathy.  LUNGS:  Moving air bilaterally. Decreased bibasilar breath sounds. No wheeze or crackles. No use of accessory muscles for breathing.  CARDIOVASCULAR:  S1, S2 regular rate and rhythm, 2/6 systolic murmur. No rubs or gallops.  ABDOMEN:  Soft, nontender, nondistended. No hepatosplenomegaly. Hyperactive bowel sounds.  EXTREMITIES:  No pedal edema, deformed left leg just above the ankle from his previous surgery, 2+ dorsalis pedis pulses palpable bilaterally.  SKIN:  No acne, rash or lesions.  LYMPHATICS:  No cervical lymphadenopathy.  NEUROLOGIC:  Cranial nerves intact. No focal motor or sensory deficits.  PSYCHOLOGICAL:  The patient is awake, alert, oriented to place and person.   LABORATORY DATA:   1.  WBC 8.9, hemoglobin 10.8, hematocrit 33.7, platelet count 320.  2.  Sodium 137, potassium 4.0, chloride 102, bicarb 25, BUN 21, creatinine 1.07, glucose 111 and calcium of 9.1.  3.  ALT 17, AST 78, alk phos 444, total bilirubin 2.1, albumin of 3.0. Troponins negative. Lipase 81. BNP is normal at 336. Upper GI swallow done about a month ago showing prominent gastroesophageal reflux disease. Abdominal x-ray including chest x-ray showing no bowel obstruction, sclerosis of the thoracolumbar spine and an patchy areas within the pelvis secondary to hyperparathyroidism versus malignancy, collaborated with PSA and alk phos.  4.  CT Head without contrast showing no acute intracranial process and  5. abdominal ultrasound revealing cholelithiasis without sonographic evidence of acute cholecystitis, hepaticsteatosis is present. EKG showing normal sinus rhythm, heart rate of 100. No acute ST-T wave  abnormality seen.   ASSESSMENT AND PLAN:  An 81 year old man with past medical history of hypertension, obstructive sleep apnea on CPAP, Parkinson's disease and gastroesophageal reflux disease, with worsening nausea and vomiting for the past week, brought in for the same reason and generalized weakness. 1.  Intractable nausea, vomiting triggered by eating, going on for 3 months but worsening over the past week. As mentioned in the history of present illness, the patient is scheduled to have an esophagogastroduodenoscopy on May 25th; however, symptoms got worse, so came to the hospital. We will get a Gastroenterology consult to see if EGD can be done. Again barium swallow revealed severe gastroesophageal reflux disease. Not sure if it is all related to his Parkinson's disease with cough and dysphagia on eating, questionable choking. Recent pneumonia could be aspiration pneumonia as well. We will get Speech Therapy to evaluate his swallowing as well. Start him on IV Protonix at this time.  2.  Dehydration with dry mucous membranes on exam, tachycardia and elevated BUN. Started IV fluids.  3.  Parkinson's disease. Continue Sinemet for now.  4.  Obstructive sleep apnea on CPAP.  5.  Hypertension. Continue home medications.  6.  Bony lesions seen in the pelvis with elevated alkaline phosphatase. Malignancy cannot be ruled out, prostatic specific antigen  ordered. Depending upon how high the prostatic specific antigen is, we will get a bone scan for Monday.  7.  Generalized weakness, likely from dehydration. We will get a Physical Therapy consult and likely rehab if needed. The family is okay with that.  CODE STATUS:  FULL CODE.   TIME SPENT ON ADMISSION:  50 minutes.    ____________________________ Gladstone Lighter, MD rk:jm D: 12/12/2012 10:49:18 ET T: 12/12/2012 11:13:35 ET JOB#: [id number]  cc: Gladstone Lighter, MD, <Dictator> Lavera Guise, MD Gladstone Lighter MD ELECTRONICALLY SIGNED 12/17/2012  15:42

## 2014-12-09 NOTE — Consult Note (Signed)
PATIENT NAME:  Adam Frank, Adam Frank MR#:  [id number] DATE OF BIRTH:  [birth year]  DATE OF CONSULTATION:  12/14/2012  CONSULTING PHYSICIAN:  Reo Portela S. Cecelia ByarsHashmi, MD  REPORT OF CONSULTATION:  This patient was seen by me in my office last week from Dr. Dalphine HandingFozia Khan's office. This patient has history of nausea/vomiting for a couple of months now, and they could not find the reason for this nausea/vomiting. Had a CAT scan of the abdomen and pelvis performed in their office, and it showed gallstones. The patient over the weekend was admitted to the hospital because of nausea/vomiting. He was seen by me today. He does not complain of any abdominal pain, and he actually does not give much of a history. He had a KUB of the abdomen performed in Emergency Room and showed possible metastatic disease in the spine area. His PCA was also elevated, so there is suspicion of prostate cancer also. Today when he was in the hospital, he was able to swallow and eat, but after that he threw up. He has a negative history in the family of colorectal cancer, liver disease or ulcers in the family.  PAST MEDICAL HISTORY:  He has Parkinson's disease, GE reflux, obstructive sleep apnea, hypertension, history of appendectomy, right leg surgery for trauma.   MEDICATIONS:  The patient is taking blood pressure medications, as well as is taking low dose aspirin, Nexium, promethazine and Sinemet. The patient also uses Ventolin 2 puffs 4 times a day.  ALLERGIES:  He is allergic to TOMATOES.  PHYSICAL EXAMINATION:  VITAL SIGNS:  Stable, temp is 97, pulse 83, respirations 20, blood pressure 106/66, pulse oximetry is 94%. GENERAL:  He is lying comfortably in bed.   HEENT:  Normal.  NECK:  Supple. No evidence of any lymphadenopathy in the neck.  HEART:  Regular rate and rhythm.  LUNGS:  Clear.  ABDOMEN:  Soft, large, nontender. No masses palpable.  EXTREMITIES: No clubbing or cyanosis. NEUROLOGIC:  Cranial nerves II to XI grossly intact. Muscle  strength bilaterally normal.   LAB WORK:  He has bilirubin slightly elevated,  glucose 115. His creatinine, sodium and potassium were also within normal limits. Ultrasound of the gallbladder was done at this hospital, showed multiple gallstones in the gallbladder, and the common bile duct was normal. He had a CAT scan of the head in the past, which was basically normal.   ASSESSMENT: 1.  Cholelithiasis.  2.  Nausea and vomiting.  PLAN ON THIS PATIENT:  I am going to do an upper GI endoscopy tomorrow on him, repeat his liver functions, and then after upper GI endoscopy done, I think we are going to remove his gallbladder, and do a cholangiogram to see if there is any stone in the common duct.    ____________________________ Alton RevereMasud S. Cecelia ByarsHashmi, MD msh:mr D: 12/14/2012 17:43:00 ET T: 12/14/2012 20:28:56 ET JOB#: [id number]  cc: Ayame Rena S. Cecelia ByarsHashmi, MD, <Dictator> Meryle ReadyMASUD S Nomar Broad MD ELECTRONICALLY SIGNED 12/17/2012 14:02

## 2014-12-09 NOTE — Consult Note (Signed)
PATIENT NAME:  Adam Frank, Adam Frank MR#:  [id number] DATE OF BIRTH:  [birth year]  DATE OF CONSULTATION:  01/27/2013  CONSULTING PHYSICIAN:  Simonne Come. Inez Pilgrim, MD  REPORT OF CONSULTATION:  Adam Frank is an 81 year old patient who was admitted on June 11 with nausea and vomiting. It was referred to as intractable, although it resolved quickly. Later it was clear that he has recurrent nausea and vomiting, although in between he has been able to eat. He has not really had abdominal pain. He has had prior GI workup. He is a patient known to me with metastatic prostate cancer. He has been previously evaluated in the hospital and in the [facility]. He was seen and evaluated on June 11.  He has multiple comorbidities that are being followed by other providers. This full dictation is  after a delay, representing the evaluation of June 11.   The patient was admitted 5 to 6 weeks ago for intractable nausea and vomiting. Had endoscopy, had ulcers, had gallbladder, had surgical evaluation, elevated enzymes had improved. He had workup including MRCP. It did not show any obstructed ducts. Did show abnormal lesions of the liver suspicious for possible metastatic involvement, cancer in the liver. He is shown to have a high PSA, over 400, an abnormal bone scan, prior scan from February, mediastinal adenopathy and tiny pulmonary nodules, so that he was to be treated for prostate cancer, and later the lung and the liver reevaluated, as there is considered a possibility of having a second malignancy. He has also had some fairly debilitating Parkinson's disease, though this had improved recently after hospitalization. With physical therapy and rehab, he had regained some strength and mobility. He remains somewhat physically impaired. He also has some cognitive issues, memory lapses, and some dementia. Recently he has been home. He has been to the [facility], and it has been determined that he has been taking Casodex for about 4  weeks, which was given initially prior to starting on Lupron treatments.   His history includes hypertension, COPD, on home oxygen, Parkinson's disease, reflux, also appendectomy and old extremity trauma, then surgery.   MEDICATIONS HAVE BEEN:  Casodex 50 mg p.o. daily, Reglan 5 mg b.i.d., mirtazapine 15 mg a night, Zofran 4 mg p.r.n., Phenergan 12.5 mg b.i.d., ProAir inhaler p.r.n., Sinemet 25/100 t.i.d., Spiriva daily.   SOCIAL HISTORY:  He smoked until a few years ago. No alcohol. He can use a walker and needs assist.   FAMILY HISTORY:  Noncontributory.   REVIEW OF SYSTEMS:  When I saw him, he was not nauseated. He was not vomiting. He was alert, he had eaten, and he was not having any abdominal pain. He did not complain of headache, and did not have and had not recently had any dizziness. No ear or jaw pain. No visual disturbances. He is chronically hard of hearing. He has had some cough, but it was not productive and no hemoptysis. He was not short of breath. No chest pain or palpitations. He denied any dysuria or blood in the urine. He is not having any cold intolerance. He is not aware of bleeding or bruising. He denied any pain or numbness in his extremities.   PHYSICAL EXAMINATION: GENERAL: He is alert and cooperative. He was in no acute distress.  His vital signs were stable.  SKIN:  He had no jaundice.  HEENT: Mouth had no thrush. He had no lymphadenopathy in the neck , supraclavicular submandibular  axilla.  HEART:  Regular.  ABDOMEN:  Nontender, firm.   protuberant. No palpable mass or organomegaly.  EXTREMITIES:  He has old leg injury and surgical deformity. Did not have significant edema. He had no rash. He has some lower extremities weaker than the upper, but they are symmetric. NEUROLOGIC:  He was alert and oriented, but did have memory lapses for his history and evidence of cognitive impairments and difficulty concentrating.   LABS: On admission, his white count was 8.0,  hemoglobin 9.1, hematocrit 28, platelets 330. Creatinine was 1.08. Bilirubin was 1.3. Alk phos was 339. Cardiac enzymes were negative. Head CT was negative for any bleed. His lipase was normal.   IMPRESSION AND PLAN: The patient has intermittent nausea and vomiting. He has previously had GI work-up. He previously had ulcers. He previously had gallstones, but cannot demonstrate significant gallbladder disease. From the provider's history, may have had syncope or presyncopal episode. He had sat on the commode and passed out when getting up. Medicine is evaluating, but certainly consistent with a vagal episode. He has been placed on telemetry. He has COPD, and is on oxygen and inhalers.   From the hem/onc point of view, he has prostate cancer, abnormal bone scan, high PSA was over 400. He has been on Casodex for 4 weeks. I am going to hold that temporarily and watch the liver functions. I do not really think it is the Casodex. He has had GI problems right from the start, but will watch for medication effect, and as soon as he can continue to tolerate, will give him Lupron. Would like to overlap those about 2 more weeks. The plan was to get him on Lupron and get him off Casodex, follow PSA in 1 and 2 months, and then reimage the chest and liver to look at pulmonary nodules, lymph nodes in the chest and the liver, compare to previous CT of the chest and previous MRCP images of the liver, and see if those areas will respond  parallel to any PSA response. Otherwise, would continue to worry about and would then have evidence for a second malignancy that it is not going to be treated successfully with hormonal therapy. Also he has an anemia, he has had low MCV, previously had normal iron studies. This is consistent with an anemia of possibly previously blood loss, but consistent with anemia of malignancy, and would just watch that for the current time. He was not symptomatic of anemia.     ____________________________ Simonne Come. Inez Pilgrim, MD rgg:mr D: 01/29/2013 18:22:00 ET T: 01/29/2013 75:44:92 ET JOB#: [id number]  cc: Simonne Come. Inez Pilgrim, MD, <Dictator> Dallas Schimke MD ELECTRONICALLY SIGNED 02/05/2013 14:03

## 2014-12-09 NOTE — Discharge Summary (Signed)
PATIENT NAME:  Adam Frank, Adam Frank MR#:  [id number] DATE OF BIRTH:  Apr 17, 1931  DATE OF ADMISSION:  01/29/2013 DATE OF DISCHARGE:  02/01/2013   PRIMARY CARE PHYSICIAN: Lyndon CodeFozia M. Khan, MD  ONCOLOGIST: Knute Neuobert G. Gittin, MD  FINAL DIAGNOSES: 1.  Nausea, vomiting.  2.  Prostate cancer with metastasis to the liver.  3.  Anemia of chronic disease.  4.  Hypokalemia and hypomagnesemia.  5.  Orthostatic hypotension.  6.  History of COPD.  MEDICATIONS ON DISCHARGE: Sinemet 25/100 one tablet 3 times a day, Spiriva 18 mcg daily, Remeron 15 mg at bedtime, promethazine 12.5 mg twice a day before meals as needed for nausea, bicalutamide 50 mg (which is Casodex) 1 tablet every 24 hours, ProAir HFA 2 puffs 4 times a day as needed for shortness of breath, metoclopramide 10 mg 3 times a day before meals, Zofran 4 mg 1 tablet 4 times a day as needed for nausea/vomiting, MiraLAX 17 grams as needed for constipation, ferrous sulfate 220 mg per 5 mL elixir 5 mL 3 times a day with meals, Protonix 40 mg twice a day, scopolamine patch apply behind  ear every 3 days, magnesium oxide 400 mg 1 tablet twice a day, vitamin D 50,000 units once a week on Mondays, granisetron (which is Kytril) 1 mg 1 tablet twice a day, K-Tab 20 mEq at bedtime.   DIET: Mechanical soft with pureed meats.   ACTIVITY: As tolerated with assistance.   REFERRAL: Physical therapy, Dr. Lorre NickGittin 1 to 2 weeks, 1 to 2 days with doctor at rehab.   REASON FOR ADMISSION: The patient was admitted 01/29/2013 and discharged 02/01/2013. The patient came in with syncopal episode, intractable nausea and vomiting.   HISTORY OF PRESENT ILLNESS: An 81 year old man with COPD, sleep apnea, hypertension, Parkinson's disease, gastroesophageal reflux disease, recently diagnosed with metastatic prostate cancer, followed by Dr. Lorre NickGittin who was recently admitted 5 weeks back and  had an upper GI endoscopy which showed small but stable ulcers, did have some gallstones. He was found  to have an elevated PSA and bone scan revealed multiple bone lesions. He went to rehab and then was discharged from rehab, went home and then came back with nausea, vomiting and passed out. He was given IV fluids, p.r.n. medications. Consultations were done by Dr. Lorre NickGittin of oncology, Dr. Harvie JuniorPhifer of palliative care; Dr. Evette CristalSankar of surgery, Dr. Servando SnareWohl of gastroenterology.  Laboratory and radiological data during the hospital course included an EKG that showed normal sinus rhythm, nonspecific ST-T wave changes. Lipase 65. Troponin negative. Glucose 97, BUN 19, creatinine 1.08, sodium 135, potassium 3.8, chloride 101, CO2 of 23, calcium 9.3, total bilirubin 1.3, alkaline phosphatase 339, ALT less than 6, AST 51, albumin 2.1. White blood cell count 8.0, hemoglobin and hematocrit 9.1 and 28.4, platelet count of 330. CT scan of the head showed no acute abnormality, diffuse cerebral atrophy. Urinalysis: Trace leukocyte esterase, 2+ bilirubin. PSA 592.6. TSH 0.526. Ultrasound of the carotids was negative. Hepatobiliary imaging: No evidence of cystic duct obstruction or acute cholecystitis. MRI of the brain with and without contrast showed severe cerebral and cerebellar atrophy with chronic white matter ischemia, no evidence of acute ischemia. Hemoglobin upon discharge 7.5, potassium 3.6, magnesium 1.7.   HOSPITAL COURSE PER PROBLEM LIST:  1.  For the patient's nausea and vomiting, this has been intermittent. I believe this is probably secondary to the liver metastases. The patient sometimes has poor appetite and sometimes can eat. I told the patient he has to eat or  else he will not survive. The patient did eat lunch prior to going out to the rehab and felt okay. He is on p.r.n. nausea medications and symptomatic management with Reglan, Kytril p.r.n., Phenergan p.r.n., Zofran and scopolamine patch.  2.  Prostate cancer with liver metastases: Dr. Lorre Nick will probably start Lupron injections. He is on Casodex.  3.  Anemia  of chronic disease: He is on iron. His hemoglobin remained low but stable. No indications for transfusion at this point.  4.  Hypokalemia and hypomagnesemia: Likely from poor appetite. These were replaced during the hospitalization. Recommend checking a BMP and magnesium on Thursday.  5.  Orthostatic hypotension, likely from the nausea, vomiting: He did have a syncopal episode prior to coming in. He is very weak. He is going to go to rehab.  6.  History of COPD: Respiratory status stable during the hospitalization.   Follow up with doctor at rehab in 1 to 2 days. Recommend a BMP and CBC on Thursday.   TIME SPENT ON ADMISSION: 35 minutes.   ____________________________ Herschell Dimes. Renae Gloss, MD rjw:jm D: 02/01/2013 13:33:59 ET T: 02/01/2013 13:52:03 ET JOB#: [id number]  cc: Herschell Dimes. Renae Gloss, MD, <Dictator> Lyndon Code, MD Knute Neu. Lorre Nick, MD  Salley Scarlet MD ELECTRONICALLY SIGNED 02/04/2013 13:13

## 2014-12-09 NOTE — Discharge Summary (Signed)
PATIENT NAME:  Adam Frank, Adam Frank MR#:  [id number] DATE OF BIRTH:  1930-08-27  DATE OF ADMISSION:  02/16/2013 DATE OF DISCHARGE:  02/23/2013  ADMITTING PHYSICIAN: Katharina Caper, M.D.  DISCHARGING PHYSICIAN:  Enid Baas, MD   PRIMARY ONCOLOGIST: Dr. Herschell Dimes IN THE HOSPITAL:  1. Palliative care consultation by Dr. Harriett Sine Phifer.  2.   Oncology consultation by Dr. Wendie Simmer  DISCHARGE DIAGNOSES: 1. Intractable nausea and vomiting.  2. 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  3. History of obstructive sleep apnea.  4. Chronic obstructive pulmonary disease.  5. Parkinson's disease. 6. Failure to thrive.  DISCHARGE MEDICATIONS: 1. Sinemet 2500 mg 1 tablet p.o. 3 times daily.  2. Spiriva inhaler daily.  3. ProAir 2 puffs 4 times a day as needed.  4. Polyethylene glycol 17 grams powder as needed for constipation.  5. Vitamin D2 2000 international units capsule p.o. once a week on Mondays.  6. Promethazine 25 mg intramuscular q. 6 hours p.r.n. for nausea and vomiting.  7. Scopolamine transdermal patch 1 patch every 72 hours.  8. Protonix 40 mg p.o. b.i.d.  9. Dexamethasone 4 mg p.o. daily.  10. Zofran 4 mg oral disintegrating tablet q. 6 hours.  11. Olanzapine 5 mg p.o. daily.  12. Reglan 10 mg p.o. 4 times a day with meals and at bedtime.   DISCHARGE DIET: As tolerated. Mechanical soft diet.   DISCHARGE ACTIVITY: As tolerated.    FOLLOWUP INSTRUCTIONS: The patient will be transferred to [facility] with palliative care follow up over that and to make transition to the long-term care with hospice services.   LABS AND IMAGING STUDIES PRIOR TO DISCHARGE: PSA is elevated at 664. Hemoglobin is low at 7.5, platelet count is low at 84, hematocrit is 24.8 and WBC is 13.3.   Sodium 145, potassium 4.1, chloride 111, bicarbonate 22, BUN 34, creatinine 1.27, glucose 105 and calcium of 8.3. Urinalysis with no infection. Urine cultures have been  negative. CT of the head done on admission showing chronic small vessel ischemic disease. Diffuse cerebral atrophy and no acute findings. CT of the abdomen and pelvis without contrast showing multiple lesions in the liver of indeterminate and extensive metastatic disease throughout the skeleton and soft tissue nodule in subcutaneous fat along the right lateral chest wall. Thickening around the rectum malignancy cannot be excluded and cholelithiasis at present.   BRIEF HOSPITAL COURSE:  The patient is an 81 year old African American gentleman with past medical history significant for recently diagnosed disseminated prostate cancer with bony mets and liver masses, possibly related to either prostate cancer or a secondary primary malignancy. He has been in and out of the hospital multiple times secondary to intractable nausea and vomiting likely related to his underlying cancer.  He was recently discharged to [facility] and comes back with similar symptoms.  1. Intractable nausea and vomiting likely related to his underlying cancer. He is being put on chemotherapy, and he is being put on hormonal treatment with Casodex for his prostate cancer because of dissemination of the disease and worsening of his underlying lesions as seen on the CAT scan and also increasing PSA, oncologist, Dr. Wendie Simmer  has stopped his treatment at this time because that could also be worsening his nausea and vomiting. He had been on multiple antiemetic regimen, which has not helped him.  He was seen by palliative care team and his antiemetics have been adjusted. At the time of discharge, he is able to tolerate some soft  diet, only a few sips of liquids. We had a discussion with the patient's wife and she is agreeable for hospice services but wanted patient to go back to [facility]. The patient will be discharged back to [facility] and palliative care nurse practitioner will follow him over there and hospice services  will be involved.  The goal is for comfort measures and no rehospitalization and symptom management at [facility].  All his other medications are being continued as he can tolerate them at this time.   DISCHARGE CONDITION: Poor.  Guarded with poor prognosis.   [facility]  TIME SPENT ON DISCHARGE: 45 minutes.    ____________________________ Enid Baasadhika Danyale Ridinger, MD rk:rw D: 02/23/2013 15:16:25 ET T: 02/23/2013 16:05:44 ET JOB#: [id number]  cc: Enid Baasadhika Younis Mathey, MD, <Dictator> Knute Neuobert G. Lorre NickGittin, MD Enid BaasADHIKA Torina Ey MD ELECTRONICALLY SIGNED 03/15/2013 13:25

## 2014-12-09 NOTE — Consult Note (Signed)
Chief Complaint:  Subjective/Chief Complaint WHEN SEEN 6/12 AFTERNOON, NO ACUTE COMPLAINTS, NO ABDO PAIN OR CP OR SOB, NO NAUSEA.Marland KitchenHAD BEEN CONFUSED PRIOR EVENING AND INTERMITTENTLY   VITAL SIGNS/ANCILLARY NOTES: **Vital Signs.:   12-Jun-14 13:55  Vital Signs Type Routine  Temperature Temperature (F) 98.3  Celsius 36.8  Temperature Source oral  Pulse Pulse 97  Respirations Respirations 20  Systolic BP Systolic BP 702  Diastolic BP (mmHg) Diastolic BP (mmHg) 75  Mean BP 88  Systolic BP Systolic BP 637  Diastolic BP (mmHg) Diastolic BP (mmHg) 74  Pulse Lying Pulse Lying 99  Systolic BP Systolic BP 858  Diastolic BP (mmHg) Diastolic BP (mmHg) 76  Pulse Pulse Sitting 101  Pulse Ox % Pulse Ox % 94  Pulse Ox Activity Level  At rest  Oxygen Delivery Room Air/ 21 %   Brief Assessment:  GEN no acute distress   Respiratory normal resp effort   Gastrointestinal details normal Nondistended   Additional Physical Exam ALERT, COOPERATIVE,   Lab Results: Thyroid:  12-Jun-14 04:01   Thyroid Stimulating Hormone 0.526 (0.45-4.50 (International Unit)  ----------------------- Pregnant patients have  different reference  ranges for TSH:  - - - - - - - - - -  Pregnant, first trimetser:  0.36 - 2.50 uIU/mL)  Hepatic:  12-Jun-14 04:01   Bilirubin, Total  1.1  Alkaline Phosphatase  318  SGPT (ALT)  < 6  SGOT (AST)  41  Total Protein, Serum  6.3  Albumin, Serum  1.8  Oncology:  12-Jun-14 04:01   Prostate Specific Ag, Roche ECLIA  592.6 (Results confirmed on dilution. Roche ECLIA methodology.                                                                      . According to the American Urological Association, Serum PSA should decrease and remain at undetectable levels after radical prostatectomy. The AUA defines biochemical recurrence as an initial PSA value 0.2 ng/mL or greater followed by a subsequent confirmatory PSA value 0.2 ng/mL or greater. Values obtained with  different assay methods or kits cannot be used interchangeably. Results cannot be interpreted as absolute evidence of the presence or absence of malignant disease.            [facility]            No: [id number]           [address] [place], [place] [address]           Lindon Romp, MD         [id number] Result(s) reported on 29 Jan 2013 at 03:49AM.)  Routine Chem:  12-Jun-14 04:01   Glucose, Serum 91  BUN 15  Creatinine (comp) 0.81  Sodium, Serum 137  Potassium, Serum 3.7  Chloride, Serum 105  CO2, Serum 23  Calcium (Total), Serum 8.9  Osmolality (calc) 274  eGFR (African American) >60  eGFR (Non-African American) >60 (eGFR values <11m/min/1.73 m2 may be an indication of chronic kidney disease (CKD). Calculated eGFR is useful in patients with stable renal function. The eGFR calculation will not be reliable in acutely ill patients when serum creatinine is changing rapidly. It is not useful in  patients on dialysis. The eGFR calculation may not  be applicable to patients at the low and high extremes of body sizes, pregnant women, and vegetarians.)  Anion Gap 9  Routine Hem:  12-Jun-14 04:01   WBC (CBC) 6.9  RBC (CBC)  3.08  Hemoglobin (CBC)  7.6  Hematocrit (CBC)  24.2  Platelet Count (CBC) 272  MCV  79  MCH  24.8  MCHC  31.5  RDW  23.3  Neutrophil % 74.0  Lymphocyte % 17.8  Monocyte % 7.0  Eosinophil % 0.2  Basophil % 1.0  Neutrophil # 5.1  Lymphocyte # 1.2  Monocyte # 0.5  Eosinophil # 0.0  Basophil # 0.1 (Result(s) reported on 28 Jan 2013 at 05:03AM.)   Assessment/Plan:  Assessment/Plan:  Assessment CANCER PROSTATE,  ANEMIA, LOW MCV, PRIOR IRON STUDIES NORMAL, NO BLEEDING, ATTRIBUTE TO MALIGNANCY. LIVER METASTASIS, PROSTATE OR SECOND MALIGNANCY. HAS BEEN ON CASODEX 4 WEEKS. NAUSEA PREDATED MEDS, LFT SLIGHT BUMP BILI IS IMPROVING.   PLAN F/U CBC, LFT, CHECK PSA, PLANNING TO START LUPRON, WOULD RESTART CASODEX 50 MG PO DAIKLY IF BILI  CONTINUES TO IMPROVE..SEE ALSO INITIAL CONSULT, PLAN TO F/U RESPONSE OF PSA, THEN REIMAGE LIVER.................Marland KitchenNOTE, PATIENT WAS SEEN AFTERNOON 6/12. THIS NOTE WAS NOT ENTERED UNTIL AM 6/13, DUE TO LOSS OF COMPUTER ACCESS, REFLECTING THE EVALUATION AT THAT TIME, BUT DATED AND TIMED FOR CONTINUITY   Electronic Signatures: Dallas Schimke (MD)  (Signed 13-Jun-14 08:52)  Authored: Chief Complaint, VITAL SIGNS/ANCILLARY NOTES, Brief Assessment, Lab Results, Assessment/Plan   Last Updated: 13-Jun-14 08:52 by Dallas Schimke (MD)

## 2014-12-09 NOTE — Consult Note (Signed)
Brief Consult Note: Diagnosis: cancer prostate  mets liver, prostate vs second malignancy parkinsons disease, low mcv anemia, normal iron.   Patient was seen by consultant.   Comments: Dictated not to follow  When seen was alert, no nausea, had eaten, no abdo pain or sob. Lungs clea, decreased air entry, abdo not tender, nompalpable nodes, no focal weakness, bili slightly higher at 1.3 than prio 1.0, alk phos better, lft nomal  Suggest  f/u lft, if bili lowe can resume casodex which is 50 mg po daily. Check psa has been ordered. Then can give first dose lupron. In approx 2 mo or if psa down sooner, plan repeat CT abdo, reevaluate liver, if liver lesions do not follow the pattern of PSA, then would need to pursue bx.  Electronic Signatures: Dallas Schimke (MD)  (Signed 11-Jun-14 22:36)  Authored: Brief Consult Note   Last Updated: 11-Jun-14 22:36 by Dallas Schimke (MD)

## 2014-12-09 NOTE — Consult Note (Signed)
Brief Consult Note: Diagnosis: Odynophagia,N/V.   Patient was seen by consultant.   Consult note dictated.   Comments: Mr. Adam Frank is an unfortunate 81 y/o black male with prostate carcinoma without tissue diagnosis & possible second primary malignancy versus metastatic disease to liver & bones followed by Dr Neale BurlyGitten.  He has dysphagia he perceives as "food slow to go down," rare odynophagia, nausea & vomiting.  He had extensive GI work-up 6 weeks ago.  He has known presbyesophagus, esophageal motility disorder related to his Parkinsons, GERD, gastric ulcers on recent EGD & cholelithiasis (not likely culprit).  He reports his nausea & vomiting has resolved & denies any abdominal pain at this time.  He is at risk for candida esophagitis given immunosuppression, but really isn't complaining of significant odynophagia to me.  I have discussed his care with Dr Servando SnareWohl.  There is no indication to repeat extensive GI work-up at this time.  Plan: 1) Agree with supportive measures including antiemetics & palliative care 2) Protonix 40mg  BID 3) Advance to Dysphagia diet as tolerated 4) Consider empiric candida treatment with diflucan if odynophagia returns  Thanks for consult Please see full dictated note #[id number].  Electronic Signatures: Joselyn ArrowJones, Lilie Vezina L (NP)  (Signed 11-Jun-14 20:51)  Authored: Brief Consult Note   Last Updated: 11-Jun-14 20:51 by Joselyn ArrowJones, Dalphine Cowie L (NP)

## 2014-12-09 NOTE — Consult Note (Signed)
Brief Consult Note: Diagnosis: nausea vomiting and cholilithiasis.   Patient was seen by consultant.   Recommend further assessment or treatment.   Orders entered.   Discussed with Attending MD.   Comments: pt has multiple gall stones and normal common bile duct .does have elevated bilirubin and alkaline phosphatase.is this due to common bile duct stone . Also has elevated PSA and on KUB possible metastatic disease in the spine from probable prostrate cancer  plan     EGD first and then will need Lap choly with cholangiogram    will follow .may need urology consult.  Electronic Signatures: Jovita GammaHashmi, Yazeed Pryer (MD)  (Signed 28-Apr-14 12:49)  Authored: Brief Consult Note   Last Updated: 28-Apr-14 12:49 by Jovita GammaHashmi, Carrine Kroboth (MD)

## 2014-12-09 NOTE — Consult Note (Signed)
Chief Complaint:  Subjective/Chief Complaint NO ACUTE COMPLAINTS, NO ABDO PAIN OR CP OR SOB, NO NAUSEA NOW .   VITAL SIGNS/ANCILLARY NOTES: **Vital Signs.:   13-Jun-14 14:31  Vital Signs Type Routine  Temperature Temperature (F) 97.6  Celsius 36.4  Temperature Source oral  Pulse Pulse 111  Respirations Respirations 20  Systolic BP Systolic BP 619  Diastolic BP (mmHg) Diastolic BP (mmHg) 84  Mean BP 103  Pulse Ox % Pulse Ox % 94  Pulse Ox Activity Level  At rest  Oxygen Delivery Room Air/ 21 %   Brief Assessment:  GEN no acute distress   Respiratory normal resp effort   Gastrointestinal details normal Nondistended   Additional Physical Exam ALERT, COOPERATIVE, SLUGGISH   Lab Results: Hepatic:  13-Jun-14 04:29   Bilirubin, Total 1.0  Alkaline Phosphatase  276  SGPT (ALT)  < 6  SGOT (AST)  40  Total Protein, Serum  5.5  Albumin, Serum  1.6  Routine Chem:  13-Jun-14 04:29   Lipase  69 (Result(s) reported on 29 Jan 2013 at 03:36PM.)  Glucose, Serum  104  BUN 11  Creatinine (comp) 0.92  Sodium, Serum 139  Potassium, Serum  3.3  Chloride, Serum 107  CO2, Serum 24  Calcium (Total), Serum  8.4  Osmolality (calc) 277  eGFR (African American) >60  eGFR (Non-African American) >60 (eGFR values <88m/min/1.73 m2 may be an indication of chronic kidney disease (CKD). Calculated eGFR is useful in patients with stable renal function. The eGFR calculation will not be reliable in acutely ill patients when serum creatinine is changing rapidly. It is not useful in  patients on dialysis. The eGFR calculation may not be applicable to patients at the low and high extremes of body sizes, pregnant women, and vegetarians.)  Anion Gap 8  Routine Hem:  13-Jun-14 04:29   WBC (CBC) 5.4  RBC (CBC)  3.11  Hemoglobin (CBC)  7.8  Hematocrit (CBC)  24.5  Platelet Count (CBC) 236  MCV  79  MCH  25.0  MCHC  31.8  RDW  23.4  Neutrophil % 75.7  Lymphocyte % 15.9  Monocyte % 8.0   Eosinophil % 0.1  Basophil % 0.3  Neutrophil # 4.1  Lymphocyte #  0.9  Monocyte # 0.4  Eosinophil # 0.0  Basophil # 0.0 (Result(s) reported on 29 Jan 2013 at 06:00AM.)   Assessment/Plan:  Assessment/Plan:  Assessment CANCER PROSTATE,  ANEMIA, LOW MCV, PRIOR IRON STUDIES NORMAL, NO BLEEDING, ATTRIBUTE TO MALIGNANCY. LIVER METASTASIS, PROSTATE OR SECOND MALIGNANCY, SMALL PULM DONULES, MEDIASTINAL LYMPHADENOPATHY . HAS BEEN ON CASODEX 4 WEEKS. NAUSEA PREDATED MEDS, LFT SLIGHT BUMP BILI IS IMPROVING NOW TO 1.0, ALK PHJOS ALSO BETTER, ALBUMIN IS LOW , PSA HIGHER .   PLAN F/U CBC, LFT, CHECK  PLANNING TO START LUPRON, WOULD RESTART CASODEX 50 MG PO DAILY IF BILI CONTINUES TO IMPROVE OR STABLE TOMORROW ..Marland KitchenEE ALSO INITIAL CONSULT, PLAN TO F/U RESPONSE OF PSA, THEN REIMAGE LIVER..Marland Kitchen  Electronic Signatures: GDallas Schimke(MD)  (Signed 13-Jun-14 18:29)  Authored: Chief Complaint, VITAL SIGNS/ANCILLARY NOTES, Brief Assessment, Lab Results, Assessment/Plan   Last Updated: 13-Jun-14 18:29 by GDallas Schimke(MD)

## 2014-12-09 NOTE — Consult Note (Signed)
PATIENT NAME:  Adam Frank, Adam MR#:  [id number] DATE OF BIRTH:  04-25-1931  DATE OF CONSULTATION:  01/29/2013  REQUESTING PHYSICIAN:  Dr. Elpidio AnisSudini  CONSULTING PHYSICIAN:  S.G. Evette CristalSankar, MD  REASON FOR THE CONSULTATION:   Intractable nausea and vomiting.  HISTORY: This is an 81 year old male who has had symptoms of nausea and vomiting for more than 4 to 5 months. The patient was admitted here sometime toward the end of April and underwent evaluation which revealed that he had some gastric ulcers, which according to the previous records suggested they were stable, and also noted to have gallstones without any evidence of acute cholecystitis. The patient states he has had episodes of significant nausea and vomiting associated with it occasionally and this happens fairly frequently. He has had no abdominal pain and describes no fever, no chills and no significant bowel changes otherwise. In the course of evaluation, the patient was found to have evidence of a likely metastatic prostatic cancer with involvement of bone and is currently on oral medication for this and being followed by Dr. Lorre NickGittin.  The patient has multiple medical issues which include hypertension, obstructive sleep apnea, chronic obstructive pulmonary disease, Parkinson's disease, gastroesophageal reflux and a more recent finding of gastric ulcers. The patient is on medication for his stomach ulcers and apparently this has not controlled his nausea or vomiting.   PAST MEDICAL HISTORY: Reviewed.  Dr. Cecelia ByarsHashmi who had seen him at the last admission for a surgical consult, had done the endoscopy and subsequently had thought that the gallstones may be responsible for his nausea and vomiting. There was mild elevation of his bilirubin and there was suspicion that there may be a common duct involvement although this was not seen on ultrasound.  No M.R.C.P. has been performed and no other interventions for this.    PHYSICAL EXAMINATION:  On examination, the  patient appears to be fairly alert and oriented. He is not in any acute distress. He has been afebrile and vital signs have been relatively stable.   HEENT: Sclerae anicteric. Conjunctivae pale. Tongue is moist, pale.   NECK: Supple. No nodes or masses palpable.   LUNGS: Clear to auscultation and percussion at this point.   HEART: Sinus rhythm without murmurs.   ABDOMEN:  Essentially unremarkable. No hernias identified. Bowel sounds are normal, a day.   LABORATORY DATA: On this admission, shows bilirubin that was 1.3 on admission is now down to 1. Alkaline phosphatase was also elevated as also the AST, ALT being below normal. White count been normal. His hemoglobin, on admission, was 9.1 and it has drifted down to 7.8 today. His hemoglobin, at time of discharge in April, was 9.1. He has not had any additional imaging at this time except for an ultrasound of carotids which showed no evidence of any significant stenosis.  The patient also had a CT of the head without contrast, which showed no acute findings; some diffuse mild atrophy was noted.  The patient reportedly is scheduled to have a hepatobiliary scan.   IMPRESSION AND RECOMMENDATIONS: Based on the finding of just nausea and vomiting without any abdominal pain, it is hard to implicate the gallstones are the cause of his problem.  I believe it would be prudent to further evaluate with a HIDA scan as planned, and if it does not show any evidence of an acutely inflamed gallbladder, I would not recommend doing a cholecystectomy at this point.  Dr. Cecelia ByarsHashmi could re-evaluate on his return after the next week. At  this point if the HIDA scan does reveal a nonvisualized gallbladder suggesting a possible acute cholecystitis, certainly a cholecystectomy would be more appropriate. I will discuss this with you. Thank you for allowing me to evaluate and help in the care of this patient.  ____________________________ S.Wynona Luna, MD sgs:rw D: 01/29/2013  14:59:35 ET T: 01/29/2013 15:12:41 ET JOB#: [id number]  cc: Timoteo Expose. Evette Cristal, MD, <Dictator> [facility] Wynona Luna MD ELECTRONICALLY SIGNED 01/29/2013 21:03

## 2014-12-09 NOTE — Consult Note (Signed)
Chief Complaint:  Subjective/Chief Complaint Pt feels well. Tolerating diet well.  Denies nausea or vomiting.  Denies abdominal pain.  Hgb stable 7.8.  Denies rectal bleeding or melena.  ALP declining.  Wife states pt much better, but may need help with home care.  Also states they were unable to afford zofran prior to [place].   VITAL SIGNS/ANCILLARY NOTES: **Vital Signs.:   13-Jun-14 05:00  Vital Signs Type Routine  Temperature Temperature (F) 97.9  Celsius 36.6  Temperature Source oral  Pulse Pulse 99  Respirations Respirations 20  Systolic BP Systolic BP 573  Diastolic BP (mmHg) Diastolic BP (mmHg) 72  Mean BP 89  Systolic BP Systolic BP 220  Diastolic BP (mmHg) Diastolic BP (mmHg) 72  Systolic BP Systolic BP 254  Diastolic BP (mmHg) Diastolic BP (mmHg) 72  Pulse Ox % Pulse Ox % 96  Pulse Ox Activity Level  At rest  Oxygen Delivery Room Air/ 21 %   Brief Assessment:  GEN well developed, well nourished, no acute distress, A/O x2   Cardiac Regular   Respiratory normal resp effort   Gastrointestinal details normal Soft  Nontender  Nondistended  Bowel sounds normal  No rebound tenderness  No gaurding   EXTR negative edema   Additional Physical Exam Skin: Warm & dry   Lab Results:  Hepatic:  13-Jun-14 04:29   Bilirubin, Total 1.0  Alkaline Phosphatase  276  SGPT (ALT)  < 6  SGOT (AST)  40  Total Protein, Serum  5.5  Albumin, Serum  1.6  Routine Chem:  13-Jun-14 04:29   Glucose, Serum  104  BUN 11  Creatinine (comp) 0.92  Sodium, Serum 139  Potassium, Serum  3.3  Chloride, Serum 107  CO2, Serum 24  Calcium (Total), Serum  8.4  Osmolality (calc) 277  eGFR (African American) >60  eGFR (Non-African American) >60 (eGFR values <17m/min/1.73 m2 may be an indication of chronic kidney disease (CKD). Calculated eGFR is useful in patients with stable renal function. The eGFR calculation will not be reliable in acutely ill patients when serum creatinine is  changing rapidly. It is not useful in  patients on dialysis. The eGFR calculation may not be applicable to patients at the low and high extremes of body sizes, pregnant women, and vegetarians.)  Anion Gap 8  Routine Hem:  13-Jun-14 04:29   WBC (CBC) 5.4  RBC (CBC)  3.11  Hemoglobin (CBC)  7.8  Hematocrit (CBC)  24.5  Platelet Count (CBC) 236  MCV  79  MCH  25.0  MCHC  31.8  RDW  23.4  Neutrophil % 75.7  Lymphocyte % 15.9  Monocyte % 8.0  Eosinophil % 0.1  Basophil % 0.3  Neutrophil # 4.1  Lymphocyte #  0.9  Monocyte # 0.4  Eosinophil # 0.0  Basophil # 0.0 (Result(s) reported on 29 Jan 2013 at 06:00AM.)   Assessment/Plan:  Assessment/Plan:  Assessment N/V: resolved on antiemetics PUD: On BID PPI.   Anemia:  Hgb stable.  No evidence of gross GI bleed.   Esophageal motility disorder/Presbyesophagus:  No complaints at present from pt.  Denies odynophagia at this time. ALP/bili elevation:  Improving   Plan 1) Continue BID Protonix 481m2) Continue scheduled zofran q6hrs 3) Continue reglan 1025mID 4) Agree w/ miralax PRN 5) Recommend advancing to dysphagia diet as tolerated 6) Discussed pt's inability to afford zofran w/ care manager.  Will provide outpatient resources to help with this. Will sign off, call if any further problems.  Thanks for consult.   Electronic Signatures: Andria Meuse (NP)  (Signed 13-Jun-14 09:56)  Authored: Chief Complaint, VITAL SIGNS/ANCILLARY NOTES, Brief Assessment, Lab Results, Assessment/Plan   Last Updated: 13-Jun-14 09:56 by Andria Meuse (NP)

## 2014-12-09 NOTE — Consult Note (Signed)
History of Present Illness:  Reason for Consult Patient with metastatic prostate cancer with significant decline in performance status.   HPI   Mr. Adam Frank is an 81 yo man with multiple medical problems including stage IV prostate cancer with bone and liver metstases (dx 12/2012) on hormonal injections, COPD on home O2, OSA, HTN, PD, GERD. Pt was admitted on 02/16/13 due to intractable nausea and vomiting, dehydration, and ARF. This is pt's 3rd hospitalization is past 2 months due to intractable nausea and vomiting. under care of Dr Lorre NickGittin and most recently treated with Lupron and Casodex.      PFSH:  Family History negative   Social History negative alcohol, negative tobacco   Comments SMOKED BUT QUIT OVER 20 YEARS AGO   Additional Past Medical and Surgical History HTN, PARKINSONS DISEASE, COPD, SLEEP APNEA, VIT D DEFICIENCY, SECONDARY HYPERPARATHYROIDISM, GERD, RIGHT LEG TRAUMA AND SURGERY   Review of Systems:  Review of Systems   Not able to obtain as patient not verbal and not communicative today.  NURSING NOTES: ED Vital Sign Flow Sheet:   01-Jul-14 22:22   Pulse Pulse: 115   Respirations Respirations: 22   SBP SBP: 95   DBP DBP: 58   Pulse Ox % Pulse Ox %: 95   Pulse Ox Source Source: Oxygen; 3   Pain Scale (0-10) Pain Scale (0-10): Scale:0   Telemetry pattern Cardiac Rhythm: Sinus tachycardia   Cardiac Monitor On: Yes  NURSING NOTES: **Vital Signs.:   03-Jul-14 13:15   Vital Signs Type: Routine   Temperature Temperature (F): 98.3   Celsius: 36.8   Temperature Source: AdultAxillary   Pulse Pulse: 115   Respirations Respirations: 22   Systolic BP Systolic BP: 107   Diastolic BP (mmHg) Diastolic BP (mmHg): 73   Mean BP: 84   Pulse Ox % Pulse Ox %: 99   Oxygen Delivery: 4L   Physical Exam:  General Very ill appearing cachectic male in no acute distress   HEENT: normal   Lungs: decreased breath sounds bilaterally   Cardiac: regular rate,  rhythm   Breast: not examined   Abdomen: soft  distended   Skin: intact   Extremities: edema  BOTH lE and Left UE   Neuro: patient lethargic and non communicative    Tomato: Other  Enalapril: Swelling  Assessment and Plan: Impression:   Mr. Adam Frank is an 81 yo man with multiple medical problems including stage IV prostate cancer with bone and liver metstases (dx 12/2012) on hormonal injections, COPD on home O2, OSA, HTN, PD, GERD. Pt was admitted on 02/16/13 due to intractable nausea and vomiting, dehydration, and ARF. This is pt's 3rd hospitalization is past 2 months due to intractable nausea and vomiting.    Plan:   Patient with metastatic prostate cancer with multiple comorbidities with rising PSA and extremely poor performance status. Would discontinue Casodex as it is not adding any benefit.appears to be terminalli ill and life expectancy days to few weeks.with hospice either at home or inpatient depending on family wishes.medicine and palliative care input and care.follow.  Electronic Signatures: Antony Hasteamiah, Dylynn Ketner S (MD)  (Signed 03-Jul-14 14:13)  Authored: HISTORY OF PRESENT ILLNESS, PFSH, ROS, NURSING NOTES, PE, ALLERGIES, ASSESSMENT AND PLAN   Last Updated: 03-Jul-14 14:13 by Antony Hasteamiah, Careen Mauch S (MD)

## 2014-12-09 NOTE — Consult Note (Signed)
PATIENT NAME:  Adam Frank, SCHAPPELL MR#:  [id number] DATE OF BIRTH:  29-Aug-1930  DATE OF CONSULTATION:  01/27/2013  REFERRING PHYSICIAN:  Gladstone Lighter, MD  CONSULTING GASTROENTEROLOGIST:  Andria Meuse, NP/Darren Allen Norris, MD   ONCOLOGIST:  Barbette Reichmann, MD   PRIMARY CARE PHYSICIAN: Clayborn Bigness, MD   REASON FOR CONSULTATION: Odynophagia, nausea and vomiting.   HISTORY OF PRESENT ILLNESS: Mr. Adam Frank is an 81 year old male who was admitted to the hospital with syncopal episode, nausea and vomiting. He had been admitted back at the end of April 2014 with similar complaints. At that time, he had a significant work-up including a barium swallow which showed prominent GERD, a small hiatal hernia and mild presbyesophagus. Films were reviewed by Dr. Gustavo Lah and showed poor peristalsis and increased dimension of the caliber of the esophagus consistent with problems related to his history of Parkinson's. He also was found to have cholelithiasis without evidence of cholecystitis. He was recently diagnosed with prostate carcinoma. He has lesions in his bones and liver which are felt to be either a second primary or metastatic disease. He has not had biopsy. He has been followed by Dr. Inez Pilgrim and has been receiving treatment for this. He had an EGD at the time which showed a hiatal hernia and 2 gastric polyps and gastric ulcers with clean bases by Dr. Phylis Bougie. He is on b.i.d. PPI. He tells me currently he is feeling well. He has had no further nausea and vomiting. He denies any abdominal pain. He really does not describe odynophagia at this time. He has had a few episodes of pain with swallowing. He states his food is "slow to go down."  He denies any regurgitation. His appetite has been somewhat depressed lately. He does have continued weakness. He had a syncopal episode while getting up from the commode.   PAST MEDICAL AND SURGICAL HISTORY:  1.  Prostate carcinoma with liver lesions, bony mets, possible second  primary.  2.  Hypertension.  3.  Obstructive sleep apnea.  4.  Parkinson's disease.  5.  GERD with gastric ulcers on last EGD 12/15/2012 by Dr. Phylis Bougie.  6.  Last colonoscopy 01/26/2005: Diverticulosis and hemorrhoids.  7.  Appendectomy. 8.  Right leg surgery.  MEDICATIONS PRIOR TO ADMISSION: Bicalutamide 50 mg q. 24 hours, metoclopramide 5 mg b.i.d., p.r.n. nausea, mirtazapine 15 mg at bedtime, ondansetron 4 mg q. 6 hours p.r.n. nausea and vomiting, ProAir HFA 90 mcg 2 puffs q.i.d. for shortness of breath, promethazine 12.5 mg 2 times daily before meals as needed for nausea,  Sinemet 25/100 mg t.i.d., Spiriva 18 mcg daily.   ALLERGIES: ENALAPRIL CAUSED SWELLING, AND TOMATOES.   FAMILY HISTORY: Noncontributory. Father deceased secondary to CVA.  Mother deceased due to old age.   SOCIAL HISTORY: He lives at home with his wife. He has a history of tobacco use, quitting several years ago. He denies any alcohol or illicit drug use.   REVIEW OF SYSTEMS:  CONSTITUTIONAL: He has fatigue, weakness, lethargy. NEUROLOGICAL:  He does have some confusion and disorientation at times.  Otherwise negative 12-point review of systems.   PHYSICAL EXAMINATION: VITAL SIGNS: Temperature 98.1, pulse 91, respirations 20, blood pressure 107/68, O2 saturation 96% on room air.  GENERAL: He is an elderly black male who is alert, oriented, pleasant and cooperative, in no acute distress.  HEENT: He has a prominent skull. Sclerae are clear and anicteric. Conjunctivae are pink. Oropharynx is pink and moist without any lesions. He is edentulous.  NECK: Supple without  any mass, thyromegaly.  CHEST: Heart regular rate and rhythm. Normal S1, S2 with a 2/6 murmur noted.  LUNGS: Clear to auscultation bilaterally. No acute distress.  ABDOMEN: Positive bowel sounds x 4. No bruits auscultated. Soft, nontender, nondistended, without palpable mass or hepatosplenomegaly. No rebound tenderness or guarding.  EXTREMITIES: Without  edema. He does have clubbing. Surgical changes to right leg just above the malleolus.  SKIN: Warm and dry without rash or jaundice.   NEUROLOGICAL: Grossly intact. He is alert and oriented x 2. PSYCHIATRIC:  He has normal mood and affect.   LABORATORY AND RADIOLOGICAL DATA:  BUN 19, sodium 135, lipase 65, otherwise normal Met-7, albumin 2.1, total bilirubin 1.3, alkaline phosphatase 339, AST 51, ALT less than 6, total protein 6.8. White blood cell count 8, hemoglobin 9.1, hematocrit 28.4, platelets 330.   Imaging: CT of head without contrast:  No acute abnormality, diffuse cerebral and cerebellar atrophy.  Soft tissue swelling in the frontal region.   IMPRESSION: Mr. Adam Frank is an unfortunate 81 year old black male with prostate carcinoma without tissue diagnosis and possible second primary malignancy versus metastatic disease to liver and bone, followed by Dr. Inez Pilgrim. He has dysphagia. He perceives his food is slow to go down, rare odynophagia, nausea and vomiting. His nausea and vomiting has actually resolved today, and he tells me he feels much better. He had an extensive GI work-up 6 weeks ago. He has known presbyesophagus, esophageal motility disorder related to his Parkinson's, GERD, gastric ulcers on recent EGD and cholelithiasis (not likely culprit of his symptoms). He reports his nausea and vomiting has resolved, and he denies any abdominal pain at this time. He is at risk for Candida esophagitis given immunosuppression but really is not complaining of significant odynophagia to me. I have discussed his care with Dr. Allen Norris. There is no indication to repeat extensive GI work-up at this time.   PLAN: 1.  Agree with supportive measures, including antiemetics and palliative care.  2.  Protonix 40 mg b.i.d.  3.  Consider advancing to dysphagia diet as tolerated.  4.  Consider empiric Candida treatment with Diflucan if odynophagia returns.   Thank you for allowing [place] to participate in the care of  Mr. Lish.   ____________________________ Andria Meuse, NP [id number] D: 01/27/2013 21:03:31 ET T: 01/27/2013 22:26:11 ET JOB#: [id number]  cc: Andria Meuse, NP, <Dictator> Lavera Guise, MD Simonne Come. Inez Pilgrim, MD Andria Meuse FNP ELECTRONICALLY SIGNED 01/29/2013 13:15

## 2014-12-09 NOTE — Consult Note (Signed)
Chief Complaint and History:  Referring Physician Dr. Tressia Miners   Chief Complaint concerns for hyperparathyroidism   Allergies:  Tomato: Other  Enalapril: Swelling  Assessment/Plan:  Assessment/Plan 81 yo AAM admitted with N/V, anorexia, and weight loss found to have elev PSA (427), elev alk phos and an abnormal bone scan with diffuse skeletal uptake. Calcium level is high-nml and PTH in the 70s. TSH nml. Patient is a poor historian. History obtained from chart review. No family at bedside. Exam unremarkable. scheduled for lap chole tomorrow.  A/ Probable metastatic prostate cancer Mild hypercalcemia with inappropriately elev PTH, possible component of mild hyperpara  P/ Given degree of PSA elev and patteren on bone scan, bony mets from prostate cancer is most probable. His elev alk phos would also be attributable to the bony mets. While his corrected calcium is mildly elev (10.5-10.7) and PTH is mildly elev, hyperpara to this mild degree would not explain pattern on the bone scan. Causes of hyperpara could be primary versus secondary hyperpara. Causes of secondary hyperpara include low Mg and low vit D. I will replace mild hypomg and will check a vit D with next set of labs. In addition, for probably metastatic prostate cancer, I will consult Oncology. I discussed the case with Dr. Cynda Acres and he will see the pt tonight or tomorrow.  Full consult to follow.   Electronic Signatures: Judi Cong (MD)  (Signed 29-Apr-14 17:15)  Authored: Chief Complaint and History, ALLERGIES, Assessment/Plan   Last Updated: 29-Apr-14 17:15 by Judi Cong (MD)

## 2014-12-09 NOTE — Consult Note (Signed)
Chief Complaint:  Subjective/Chief Complaint NO ACUTE COMPLAINTS  DENIED BACK PAIN NO SOB NO CP  NO APPETITE NOT NAUSEOUS   VITAL SIGNS/ANCILLARY NOTES: **Vital Signs.:   01-May-14 04:04  Vital Signs Type Routine  Temperature Temperature (F) 98.3  Celsius 36.8  Temperature Source oral  Pulse Pulse 82  Respirations Respirations 18  Systolic BP Systolic BP 993  Diastolic BP (mmHg) Diastolic BP (mmHg) 83  Mean BP 107  Pulse Ox % Pulse Ox % 96  Pulse Ox Activity Level  At rest  Oxygen Delivery Room Air/ 21 %   Brief Assessment:  Gastrointestinal details normal Nontender  Nondistended   Additional Physical Exam ALERT AND COOPERATIVE, SLIGHTLY SLUGGISH   BILATERAL STIFF OR FROZEN SHOULDERS, OTHERWISE INTACT STRENGTH UPPER EXT. LOWER EXT CAN LIFT .FLEX HIPS AGAINST GRAVITY, AND FLEX/EXTEND KNEES AND ANKLES TO 4/5.Marland KitchenMarland KitchenSEEN AND EVALUATED WITH WIFE IN THE ROOM,  WHO CONFIRMS THAT IS STRENGTH AND MOVEMENT IS STABLE , TODAY, COMPARED TO RECENT WEEKS OR MONTH, ONLY WAS GENERALLY TIRED FROM NOT EATING AND A LITTLE LESS MOBILE  THAN PRIOR MONTHS. ALSO, THERE IS NO RIGHT SIDED WEAKNESS, AND NO HX OF RIGHT SIDED WEAKNESS.  ALSO, REVEALED THAT PSA HAS BEEN HIGH SINCE JAN, AND [facility] HAS SUGGESTED BX. NO NUMBNESS OF EXT, INTACT LIGHT TOUCH, NORMAL BOWEL AND BLADDER CONTROL AND SENSATION   Lab Results: Hepatic:  01-May-14 06:04   Bilirubin, Total  1.1  Alkaline Phosphatase  459  SGPT (ALT)  10  SGOT (AST)  63  Total Protein, Serum  5.8  Albumin, Serum  1.3  Routine Chem:  01-May-14 06:04   Glucose, Serum  107  BUN 14  Creatinine (comp) 0.75  Sodium, Serum  135  Potassium, Serum 4.4  Chloride, Serum 103  CO2, Serum 23  Osmolality (calc) 271  eGFR (African American) >60  eGFR (Non-African American) >60 (eGFR values <5m/min/1.73 m2 may be an indication of chronic kidney disease (CKD). Calculated eGFR is useful in patients with stable renal function. The eGFR calculation will  not be reliable in acutely ill patients when serum creatinine is changing rapidly. It is not useful in  patients on dialysis. The eGFR calculation may not be applicable to patients at the low and high extremes of body sizes, pregnant women, and vegetarians.)  Anion Gap 9  Calcium (Total), Serum  8.3 (Result(s) reported on 17 Dec 2012 at 06:31AM.)   Assessment/Plan:  Assessment/Plan:  Assessment HIGH PSA, NO TISSUE BX, BUT LABS AND SCANS CONSISTENT WITH STAGE 4 PROSTATE CANCER. NO URINARY SYMPTOMS. ALSO PARKINSONS DISEASE.  NOTABLY, HAVE CONFIRMED THAT NEURO EXAM IS STABLE, BOTH FROM WEEKS PAST, AND ALSO NO CHANGE FROM ONE DOSE DECADRON GIVEN LAST NIGHT. ALSO BILIRUBIN BETTER TODAY. ALSO DENIES BACK PAIN. STILL ISSUES OF NAUSEA, ALTHOUGH NOT ATTHIS TIME, AND APPETITE LOSS, WHICH CAN BE FROM MALIGNANCY   Plan WOULD CONTACT [facility]. I AM NOW NOT CONCERED THAT MRI SPINE NEEDED. IF NEURO DECLINE RATHER THAN IMPROVEMENT WITH REHAB THEN WOULD RECONSIDER. EVEN WITHOUT PROSTATE BX, WOULD STILL BEGIN CASODEX 5O MG PO DAILY, CHECK LFT ONCE A WEEK, START LUPRON IN 2 WEEKS   Electronic Signatures: GDallas Schimke(MD)  (Signed 01-May-14 08:41)  Authored: Chief Complaint, VITAL SIGNS/ANCILLARY NOTES, Brief Assessment, Lab Results, Assessment/Plan   Last Updated: 01-May-14 08:41 by GDallas Schimke(MD)

## 2014-12-09 NOTE — Consult Note (Signed)
Chief Complaint:  Subjective/Chief Complaint NO ACUTE COMPLAINTS, NO ABDO PAIN OR CP OR SOB, NO VOMITING BUT CONTINUED ANOREXIA AND NAUSEA.   VITAL SIGNS/ANCILLARY NOTES: **Vital Signs.:   14-Jun-14 04:00  Vital Signs Type Routine  Temperature Temperature (F) 98.3  Celsius 36.8  Pulse Pulse 95  Respirations Respirations 20  Systolic BP Systolic BP 536  Diastolic BP (mmHg) Diastolic BP (mmHg) 76  Mean BP 92  Pulse Ox % Pulse Ox % 94  Pulse Ox Activity Level  At rest  Oxygen Delivery Room Air/ 21 %    12:58  Vital Signs Type Routine  Systolic BP Systolic BP 144  Diastolic BP (mmHg) Diastolic BP (mmHg) 74  Mean BP 86  Pulse Ox % Pulse Ox % 94  Pulse Ox Activity Level  At rest  Oxygen Delivery Room Air/ 21 %   Brief Assessment:  GEN no acute distress   Respiratory normal resp effort   Gastrointestinal details normal Nondistended   Additional Physical Exam ALERT, COOPERATIVE, SLUGGISH   Lab Results: Hepatic:  14-Jun-14 05:33   Bilirubin, Total 0.9  Bilirubin, Direct  0.4 (Result(s) reported on 30 Jan 2013 at 06:16AM.)  Alkaline Phosphatase  269  SGPT (ALT)  < 6  SGOT (AST) 28  Total Protein, Serum  5.4  Albumin, Serum  1.6  Routine Chem:  14-Jun-14 05:33   Potassium, Serum 3.5 (Result(s) reported on 30 Jan 2013 at 06:12AM.)  Routine Hem:  14-Jun-14 05:33   Hemoglobin (CBC)  7.4 (Result(s) reported on 30 Jan 2013 at 06:09AM.)   Assessment/Plan:  Assessment/Plan:  Assessment CANCER PROSTATE,  ANEMIA, LOW MCV, PRIOR IRON STUDIES NORMAL, NO BLEEDING, ATTRIBUTE TO MALIGNANCY. LIVER METASTASIS, PROSTATE OR SECOND MALIGNANCY, SMALL PULM NODULES, MEDIASTINAL LYMPHADENOPATHY . HAS BEEN ON CASODEX 4 WEEKS. NAUSEA PREDATED MEDS, LFT SLIGHT BUMP BILI IS IMPROVING NOW TO 0.9, ALK PHOS ALSO BETTER, ALBUMIN IS LOW , PSA HIGHER .   PLAN F/U CBC, LFT,   PLANNING TO START LUPRON, WOULD RESTART CASODEX 50 MG PO DAILY TODAY , IFSTABLE CASN GIVE DOSE OF LUPRON MID WEEK .Marland KitchenSEE ALSO  INITIAL CONSULT, PLAN TO F/U RESPONSE OF PSA, THEN REIMAGE LIVER..  ALSO, AD DISCUSSED TODAY, WITH MENTAL STATUS IMPAIRED, AND PERSISTENT NAUSEA, PLUS MALIGNANCY WITH LIKELY LIVER METASTASIS, NEED TO CONSIDER BRAIN METASTASIS, WIILL GET MRI   Electronic Signatures: Dallas Schimke (MD)  (Signed 14-Jun-14 13:09)  Authored: Chief Complaint, VITAL SIGNS/ANCILLARY NOTES, Brief Assessment, Lab Results, Assessment/Plan   Last Updated: 14-Jun-14 13:09 by Dallas Schimke (MD)
